# Patient Record
Sex: Male | Born: 1960 | Race: Black or African American | Hispanic: No | Marital: Married | Smoking: Never smoker
Health system: Southern US, Community
[De-identification: ages and names within clinical notes are randomized; demographics above are authoritative.]

## PROBLEM LIST (undated history)

## (undated) DIAGNOSIS — A770 Spotted fever due to Rickettsia rickettsii: Secondary | ICD-10-CM

## (undated) DIAGNOSIS — T7840XA Allergy, unspecified, initial encounter: Secondary | ICD-10-CM

## (undated) DIAGNOSIS — K589 Irritable bowel syndrome without diarrhea: Secondary | ICD-10-CM

## (undated) DIAGNOSIS — Z973 Presence of spectacles and contact lenses: Secondary | ICD-10-CM

## (undated) DIAGNOSIS — I10 Essential (primary) hypertension: Secondary | ICD-10-CM

## (undated) DIAGNOSIS — J45909 Unspecified asthma, uncomplicated: Secondary | ICD-10-CM

## (undated) DIAGNOSIS — K219 Gastro-esophageal reflux disease without esophagitis: Secondary | ICD-10-CM

## (undated) HISTORY — DX: Gastro-esophageal reflux disease without esophagitis: K21.9

## (undated) HISTORY — DX: Irritable bowel syndrome, unspecified: K58.9

## (undated) HISTORY — DX: Unspecified asthma, uncomplicated: J45.909

## (undated) HISTORY — DX: Essential (primary) hypertension: I10

## (undated) HISTORY — DX: Allergy, unspecified, initial encounter: T78.40XA

---

## 2005-09-19 HISTORY — PX: SHOULDER SURGERY: SHX246

## 2017-09-19 HISTORY — PX: OTHER SURGICAL HISTORY: SHX169

## 2018-09-05 ENCOUNTER — Ambulatory Visit: Payer: Federal, State, Local not specified - PPO | Admitting: Family Medicine

## 2018-09-05 ENCOUNTER — Encounter: Payer: Self-pay | Admitting: Family Medicine

## 2018-09-05 DIAGNOSIS — K582 Mixed irritable bowel syndrome: Secondary | ICD-10-CM | POA: Diagnosis not present

## 2018-09-05 DIAGNOSIS — M7542 Impingement syndrome of left shoulder: Secondary | ICD-10-CM | POA: Diagnosis not present

## 2018-09-05 DIAGNOSIS — J321 Chronic frontal sinusitis: Secondary | ICD-10-CM

## 2018-09-05 DIAGNOSIS — J329 Chronic sinusitis, unspecified: Secondary | ICD-10-CM | POA: Insufficient documentation

## 2018-09-05 DIAGNOSIS — K589 Irritable bowel syndrome without diarrhea: Secondary | ICD-10-CM | POA: Insufficient documentation

## 2018-09-05 MED ORDER — DICYCLOMINE HCL 20 MG PO TABS: 20.0000 mg | ORAL_TABLET | Freq: Four times a day (QID) | ORAL | 3 refills | Status: DC

## 2018-09-05 MED ORDER — IBUPROFEN 800 MG PO TABS: 800.0000 mg | ORAL_TABLET | Freq: Three times a day (TID) | ORAL | 5 refills | Status: DC | PRN

## 2018-09-05 MED ORDER — MONTELUKAST SODIUM 10 MG PO TABS: 10.0000 mg | ORAL_TABLET | Freq: Every day | ORAL | 3 refills | Status: DC

## 2018-09-05 MED ORDER — RANITIDINE HCL 150 MG PO CAPS: 150.0000 mg | ORAL_CAPSULE | Freq: Two times a day (BID) | ORAL | 3 refills | Status: DC

## 2018-09-05 MED ORDER — LACTULOSE 10 GM/15ML PO SOLN: 10.0000 g | Freq: Every day | ORAL | 3 refills | Status: DC | PRN

## 2018-09-05 NOTE — Progress Notes (Signed)
BP 132/81   Pulse 61   Temp (!) 97.5 F (36.4 C) (Oral)   Ht 5\' 10"  (1.778 m)   Wt 191 lb 9.6 oz (86.9 kg)   BMI 27.49 kg/m    Subjective:    Patient ID: George Garcia, male    DOB: 1961-07-13, 57 y.o.   MRN: [id number]  HPI: George Garcia is a 57 y.o. male presenting on 09/05/2018 for New Patient (Initial Visit) ([place] ) and Establish Care   HPI Chronic sinus Patient is coming in his new patient establish care with our office.  He has chronic sinus congestion and takes Singulair for this and it has been working well, he also uses Flonase for this to help out which has also been helping.  He denies any shortness of breath or wheezing or asthma.  Irritable bowel syndrome with intermittent constipation and diarrhea Patient has known irritable bowel syndrome with both intermittent constipation and diarrhea and he takes dicyclomine and lactulose for this which have been helping him for quite some time.  He believes he has had it for about 3 years.  He denies any abdominal pain but just gets intermittent bloating and gassiness.  He has had colonoscopy within the past few years which was essentially normal.  Patient has left shoulder pain that is been bothering him more recently.  He says the pain hurts more when he goes over his head or goes across his body but sometimes hurts with all range of motion.  He denies any weakness or numbness in that arm but just says that he has the pain with certain ranges of motion.  He thinks he was diagnosed once in the past with impingement syndrome of the shoulder.  He cannot recall if it was this older or the other one that he was having issues with previously.  Patient would like referral to orthopedic for this  Relevant past medical, surgical, family and social history reviewed and updated as indicated. Interim medical history since our last visit reviewed. Allergies and medications reviewed and updated.  Review of Systems  Constitutional: Negative for  chills and fever.  HENT: Positive for congestion, rhinorrhea, sinus pressure and sneezing. Negative for sinus pain and sore throat.   Eyes: Negative for visual disturbance.  Respiratory: Negative for cough, shortness of breath and wheezing.   Cardiovascular: Negative for chest pain and leg swelling.  Gastrointestinal: Positive for constipation and diarrhea. Negative for abdominal distention and abdominal pain.  Musculoskeletal: Positive for arthralgias. Negative for back pain, gait problem and joint swelling.  Skin: Negative for rash.  Neurological: Negative for dizziness, weakness, light-headedness and numbness.  All other systems reviewed and are negative.   Per HPI unless specifically indicated above  Social History   Socioeconomic History  . Marital status: Married    Spouse name: Not on file  . Number of children: Not on file  . Years of education: Not on file  . Highest education level: Not on file  Occupational History  . Not on file  Social Needs  . Financial resource strain: Not on file  . Food insecurity:    Worry: Not on file    Inability: Not on file  . Transportation needs:    Medical: Not on file    Non-medical: Not on file  Tobacco Use  . Smoking status: Never Smoker  . Smokeless tobacco: Never Used  Substance and Sexual Activity  . Alcohol use: Never    Frequency: Never  . Drug use: Never  .  Sexual activity: Yes    Comment: married since 1999, 3 child 73, 85, 96  Lifestyle  . Physical activity:    Days per week: Not on file    Minutes per session: Not on file  . Stress: Not on file  Relationships  . Social connections:    Talks on phone: Not on file    Gets together: Not on file    Attends religious service: Not on file    Active member of club or organization: Not on file    Attends meetings of clubs or organizations: Not on file    Relationship status: Not on file  . Intimate partner violence:    Fear of current or ex partner: Not on file     Emotionally abused: Not on file    Physically abused: Not on file    Forced sexual activity: Not on file  Other Topics Concern  . Not on file  Social History Narrative  . Not on file    Past Surgical History:  Procedure Laterality Date  . SHOULDER SURGERY Right 2007    Family History  Problem Relation Age of Onset  . Diabetes Mother   . Stroke Father     Allergies as of 09/05/2018   No Known Allergies     Medication List       Accurate as of September 05, 2018 10:23 AM. Always use your most recent med list.        dicyclomine 20 MG tablet Commonly known as:  BENTYL Take 1 tablet (20 mg total) by mouth every 6 (six) hours.   fluticasone 50 MCG/ACT nasal spray Commonly known as:  FLONASE Place into both nostrils daily.   ibuprofen 800 MG tablet Commonly known as:  ADVIL,MOTRIN Take 1 tablet (800 mg total) by mouth every 8 (eight) hours as needed.   lactulose 10 GM/15ML solution Commonly known as:  CHRONULAC Take 15 mLs (10 g total) by mouth daily as needed for mild constipation.   montelukast 10 MG tablet Commonly known as:  SINGULAIR Take 1 tablet (10 mg total) by mouth at bedtime.   ranitidine 150 MG capsule Commonly known as:  ZANTAC Take 1 capsule (150 mg total) by mouth 2 (two) times daily.          Objective:    BP 132/81   Pulse 61   Temp (!) 97.5 F (36.4 C) (Oral)   Ht 5\' 10"  (1.778 m)   Wt 191 lb 9.6 oz (86.9 kg)   BMI 27.49 kg/m   Wt Readings from Last 3 Encounters:  09/05/18 191 lb 9.6 oz (86.9 kg)    Physical Exam Vitals signs and nursing note reviewed.  Constitutional:      General: He is not in acute distress.    Appearance: He is well-developed. He is not diaphoretic.  HENT:     Nose: No congestion or rhinorrhea.     Mouth/Throat:     Mouth: Mucous membranes are moist.     Pharynx: Oropharynx is clear. No posterior oropharyngeal erythema.  Eyes:     General: No scleral icterus.    Conjunctiva/sclera: Conjunctivae normal.   Neck:     Musculoskeletal: Neck supple.     Thyroid: No thyromegaly.  Cardiovascular:     Rate and Rhythm: Normal rate and regular rhythm.     Heart sounds: Normal heart sounds. No murmur.  Pulmonary:     Effort: Pulmonary effort is normal. No respiratory distress.     Breath  sounds: Normal breath sounds. No wheezing.  Abdominal:     General: Abdomen is flat. Bowel sounds are normal. There is no distension.     Palpations: Abdomen is soft.     Tenderness: There is no abdominal tenderness. There is no guarding.  Musculoskeletal: Normal range of motion.     Left shoulder: He exhibits tenderness (Left lateral shoulder tenderness, also tender and painful with cross body and overhead range of motion). He exhibits normal range of motion, no swelling, no deformity and normal strength.  Lymphadenopathy:     Cervical: No cervical adenopathy.  Skin:    General: Skin is warm and dry.     Findings: No rash.  Neurological:     Mental Status: He is alert and oriented to person, place, and time.     Coordination: Coordination normal.  Psychiatric:        Behavior: Behavior normal.     No results found for this or any previous visit.    Assessment & Plan:   Problem List Items Addressed This Visit      Respiratory   Chronic sinusitis   Relevant Medications   fluticasone (FLONASE) 50 MCG/ACT nasal spray     Digestive   IBS (irritable bowel syndrome)   Relevant Medications   dicyclomine (BENTYL) 20 MG tablet   ranitidine (ZANTAC) 150 MG capsule   lactulose (CHRONULAC) 10 GM/15ML solution     Other   Impingement syndrome of left shoulder   Relevant Medications   ibuprofen (ADVIL,MOTRIN) 800 MG tablet   Other Relevant Orders   Ambulatory referral to Orthopedic Surgery       Follow up plan: Return in about 1 year (around 09/06/2019), or if symptoms worsen or fail to improve.  Arville CareJoshua Maurie Olesen, MD [facility] 09/05/2018, 10:23 AM

## 2018-09-24 ENCOUNTER — Telehealth: Payer: Self-pay | Admitting: Family Medicine

## 2018-09-24 MED ORDER — OMEPRAZOLE 20 MG PO CPDR: 20.0000 mg | DELAYED_RELEASE_CAPSULE | Freq: Every day | ORAL | 3 refills | Status: DC

## 2018-09-24 NOTE — Telephone Encounter (Signed)
Have the patient switch to Pepcid 20 mg twice daily which she can also get over-the-counter as well.  Or we can send a prescription, if he wants a prescription go ahead and send him a years worth of it.

## 2018-09-24 NOTE — Telephone Encounter (Signed)
Switch to what?

## 2018-09-24 NOTE — Telephone Encounter (Signed)
Insurance will not cover Pepcid, could you send in omeprazole

## 2018-09-24 NOTE — Telephone Encounter (Signed)
Yes sent omeprazole for him

## 2018-09-24 NOTE — Telephone Encounter (Signed)
Pt aware.

## 2018-10-02 ENCOUNTER — Telehealth: Payer: Self-pay | Admitting: Family Medicine

## 2018-10-02 NOTE — Telephone Encounter (Signed)
PT is states that the  omeprazole (PRILOSEC) 20 MG capsule is not strong up, states that he is wanting [place] to up the 60 MG he has took that before, pt does take this medication twice a day   Pharmacy: [facility]

## 2018-10-02 NOTE — Telephone Encounter (Signed)
Please review and advise.

## 2018-10-03 MED ORDER — OMEPRAZOLE 40 MG PO CPDR: 40.0000 mg | DELAYED_RELEASE_CAPSULE | Freq: Two times a day (BID) | ORAL | 3 refills | Status: DC

## 2018-10-03 NOTE — Telephone Encounter (Signed)
Patient aware and verbalizes understanding. 

## 2018-10-03 NOTE — Telephone Encounter (Signed)
I sent it for 40mg , I have never seen a 60 mg for this medication, 40 mg is usually the top dose, he can take 40 mg twice daily and I sent a new prescription for that.

## 2018-10-10 ENCOUNTER — Ambulatory Visit (INDEPENDENT_AMBULATORY_CARE_PROVIDER_SITE_OTHER): Payer: Federal, State, Local not specified - PPO | Admitting: Orthopaedic Surgery

## 2018-10-10 ENCOUNTER — Ambulatory Visit (INDEPENDENT_AMBULATORY_CARE_PROVIDER_SITE_OTHER): Payer: Federal, State, Local not specified - PPO

## 2018-10-10 ENCOUNTER — Encounter (INDEPENDENT_AMBULATORY_CARE_PROVIDER_SITE_OTHER): Payer: Self-pay | Admitting: Orthopaedic Surgery

## 2018-10-10 VITALS — BP 134/83 | HR 54 | Ht 70.0 in | Wt 185.0 lb

## 2018-10-10 DIAGNOSIS — M25512 Pain in left shoulder: Secondary | ICD-10-CM | POA: Diagnosis not present

## 2018-10-10 DIAGNOSIS — G8929 Other chronic pain: Secondary | ICD-10-CM | POA: Insufficient documentation

## 2018-10-10 NOTE — Progress Notes (Signed)
Office Visit Note   Patient: George Garcia           Date of Birth: 05/06/1961           MRN: [id number] Visit Date: 10/10/2018              Requested by: Dettinger, Elige Radon, MD [address] [place], [place] [address] PCP: Dettinger, Elige Radon, MD   Assessment & Plan: Visit Diagnoses:  1. Chronic left shoulder pain     Plan: MRI arthrogram.  Long discussion regarding diagnostic possibilities.  Doubt rotator cuff tear but certainly could have a labral tear.  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  Return after the MRI arthrogram. office visit over 40 minutes 50% of the time in counseling  Follow-Up Instructions: Return after MRI arthrogram left shoulder.   Orders:  Orders Placed This Encounter  Procedures  . XR Shoulder Left  . MR Shoulder Left w/ contrast  . DL FLUORO GUIDED NEEDLE PLC ASPIRATION / INJECTTION/LOC   No orders of the defined types were placed in this encounter.     Procedures: No procedures performed   Clinical Data: No additional findings.   Subjective: Chief Complaint  Patient presents with  . Left Shoulder - Pain  Patient presents with left anterior shoulder pain x 2 years. He denies any known injury.  He describes his pain as 2/10 and states it is painful to raise his arm out in the front or out to the side.  He denies popping or catching.  He has used lidocaine ointment, ibuprofen 800mg , diclofenac gel, prednisone, and has had injections, with the last one being 6 months ago.  He states that these do not help anymore.  Mr. Oetken recently moved to the area about 3 months ago.  Prior to that he had been treated for chronic left shoulder pain in [place].  I do have notes from his orthopedist noting that he had some arthritic changes at the [facility] joint.  He had recommended an MRI scan.  He has been followed over a  period of several years with a combination of cortisone injections in the subacromial region, the acromioclavicular joint and  anti-inflammatory medicines.  He did have relief of his pain on each occasion only to have it recur.  He is having difficulty with overhead activity.  Not having any neck pain or numbness or tingling referred to other upper extremity.  Pain is localized in the area of the anterior shoulder particularly with motion.  He does have difficulty placing his arm over his head.  Has tried lidocaine ointment and diclofenac cream in addition.  Not experiencing any popping or catching HPI  Review of Systems   Objective: Vital Signs: BP 134/83   Pulse (!) 54   Ht 5\' 10"  (1.778 m)   Wt 185 lb (83.9 kg)   BMI 26.54 kg/m   Physical Exam Constitutional:      Appearance: He is well-developed.  Eyes:     Pupils: Pupils are equal, round, and reactive to light.  Pulmonary:     Effort: Pulmonary effort is normal.  Skin:    General: Skin is warm and dry.  Neurological:     Mental Status: He is alert and oriented to person, place, and time.  Psychiatric:        Behavior: Behavior normal.     Ortho Exam awake alert and oriented x3.  Comfortable sitting.  Able to place his left arm over his head  with somewhat of a circuitous motion.  No loss of motion or evidence of instability.  Pain seems to be localized along the anterior glenohumeral joint.  No masses.  Some pain at the acromioclavicular joint and the anterior subacromial region.  Does have a positive empty can and impingement testing.  Speed sign appeared to be negative  Specialty Comments:  No specialty comments available.  Imaging: Xr Shoulder Left  Result Date: 10/10/2018 Films of the left shoulder obtained in several projections.  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  Humeral head is centered about the glenoid.  Normal space between the humeral head and the acromion.  No acute change.  No ectopic calcification.    PMFS History: Patient Active Problem List   Diagnosis Date Noted  .  Chronic left shoulder pain 10/10/2018  . Impingement syndrome of left shoulder 09/05/2018  . IBS (irritable bowel syndrome) 09/05/2018  . Chronic sinusitis 09/05/2018   Past Medical History:  Diagnosis Date  . Allergy   . Asthma   . GERD (gastroesophageal reflux disease)     Family History  Problem Relation Age of Onset  . Diabetes Mother   . Stroke Father     Past Surgical History:  Procedure Laterality Date  . SHOULDER SURGERY Right 2007   Social History   Occupational History  . Not on file  Tobacco Use  . Smoking status: Never Smoker  . Smokeless tobacco: Never Used  Substance and Sexual Activity  . Alcohol use: Never    Frequency: Never  . Drug use: Never  . Sexual activity: Yes    Comment: married since 1999, 3 child 52, 16, 63

## 2018-10-24 ENCOUNTER — Ambulatory Visit (INDEPENDENT_AMBULATORY_CARE_PROVIDER_SITE_OTHER): Payer: Federal, State, Local not specified - PPO | Admitting: Orthopaedic Surgery

## 2018-10-26 ENCOUNTER — Telehealth: Payer: Self-pay | Admitting: Family Medicine

## 2018-10-26 ENCOUNTER — Other Ambulatory Visit: Payer: Self-pay | Admitting: *Deleted

## 2018-10-26 DIAGNOSIS — M7542 Impingement syndrome of left shoulder: Secondary | ICD-10-CM

## 2018-10-26 NOTE — Telephone Encounter (Signed)
We can go ahead and do the referral to Dr. Sonny Masters far for him, orthopedics but I do not know that his left shoulder pain would be related to a rheumatological condition unless he was instructed by his orthopedic to go see her.

## 2018-10-26 NOTE — Telephone Encounter (Signed)
What type of referral do you need? Orthopedic rheumatology  Have you been seen at our office for this problem? Dr. Pollyann Savoy, MD  (If no, schedule them an appointment.  They will need to be seen before a referral can be done.)  Is there a particular doctor or location that you prefer? [place]  Patient notified that referrals can take up to a week or longer to process. If they haven't heard anything within a week they should call back and speak with the referral department.   Pt has an mri apt next week he going to call to cancel because he was to see Dr Corliss Skains first.

## 2018-10-26 NOTE — Telephone Encounter (Signed)
Patient aware.

## 2018-10-31 ENCOUNTER — Ambulatory Visit (INDEPENDENT_AMBULATORY_CARE_PROVIDER_SITE_OTHER): Payer: Federal, State, Local not specified - PPO | Admitting: Orthopaedic Surgery

## 2018-11-07 DIAGNOSIS — M7542 Impingement syndrome of left shoulder: Secondary | ICD-10-CM | POA: Diagnosis not present

## 2018-11-07 DIAGNOSIS — M25512 Pain in left shoulder: Secondary | ICD-10-CM | POA: Diagnosis not present

## 2018-11-13 DIAGNOSIS — M25512 Pain in left shoulder: Secondary | ICD-10-CM | POA: Diagnosis not present

## 2018-11-20 DIAGNOSIS — M25512 Pain in left shoulder: Secondary | ICD-10-CM | POA: Diagnosis not present

## 2018-11-24 ENCOUNTER — Other Ambulatory Visit: Payer: Self-pay | Admitting: Family Medicine

## 2018-12-05 ENCOUNTER — Ambulatory Visit: Payer: Federal, State, Local not specified - PPO | Admitting: Family Medicine

## 2018-12-05 ENCOUNTER — Other Ambulatory Visit: Payer: Self-pay

## 2018-12-05 ENCOUNTER — Encounter: Payer: Self-pay | Admitting: Family Medicine

## 2018-12-05 VITALS — BP 139/92 | HR 66 | Temp 98.4°F | Ht 70.0 in | Wt 189.8 lb

## 2018-12-05 DIAGNOSIS — M898X1 Other specified disorders of bone, shoulder: Secondary | ICD-10-CM | POA: Diagnosis not present

## 2018-12-05 DIAGNOSIS — K219 Gastro-esophageal reflux disease without esophagitis: Secondary | ICD-10-CM

## 2018-12-05 MED ORDER — CIMETIDINE 200 MG PO TABS: 200.0000 mg | ORAL_TABLET | Freq: Two times a day (BID) | ORAL | 3 refills | Status: DC

## 2018-12-05 MED ORDER — MELOXICAM 15 MG PO TABS: 15.0000 mg | ORAL_TABLET | Freq: Every day | ORAL | 3 refills | Status: DC

## 2018-12-05 MED ORDER — OMEPRAZOLE 40 MG PO CPDR: 40.0000 mg | DELAYED_RELEASE_CAPSULE | Freq: Two times a day (BID) | ORAL | 3 refills | Status: DC

## 2018-12-05 NOTE — Progress Notes (Signed)
BP (!) 139/92   Pulse 66   Temp 98.4 F (36.9 C) (Oral)   Ht 5\' 10"  (1.778 m)   Wt 189 lb 12.8 oz (86.1 kg)   BMI 27.23 kg/m    Subjective:    Patient ID: George Garcia, male    DOB: 1961-01-02, 58 y.o.   MRN: [id number]  HPI: George Garcia is a 58 y.o. male presenting on 12/05/2018 for Gastroesophageal Reflux (Patient states that the omeptazole is not helping with his acid as much) and Arthritis (Left clavical )   HPI GERD Patient is currently on Meprazole but feels like it is not working as well, patient said that before ranitidine went on recall that it worked a lot better and would like to try something similar if possible..  He has been having a lot of complaints of belching and burping and acid and indigestion.  He also complains of epigastric abdominal discomfort.  Shoulder/ clavicle pain Patient comes in complaining of continued right shoulder pain and says that he was diagnosed with edema around the clavicle by the orthopedic and did not know what necessarily needed to be done for this, he has been taking Advil but nothing further at this point.  Patient denies any loss of range of motion but just has pain with motion of that shoulder.  Relevant past medical, surgical, family and social history reviewed and updated as indicated. Interim medical history since our last visit reviewed. Allergies and medications reviewed and updated.  Review of Systems  Constitutional: Negative for chills and fever.  Respiratory: Negative for shortness of breath and wheezing.   Cardiovascular: Negative for chest pain and leg swelling.  Gastrointestinal: Positive for abdominal pain. Negative for constipation, diarrhea, nausea and vomiting.  Musculoskeletal: Positive for arthralgias. Negative for back pain and gait problem.  Skin: Negative for rash.  All other systems reviewed and are negative.   Per HPI unless specifically indicated above   Allergies as of 12/05/2018   No Known Allergies      Medication List       Accurate as of December 05, 2018 11:27 AM. Always use your most recent med list.        cimetidine 200 MG tablet Commonly known as:  TAGAMET Take 1 tablet (200 mg total) by mouth 2 (two) times daily.   diclofenac sodium 1 % Gel Commonly known as:  VOLTAREN Apply 2 g topically 4 (four) times daily.   dicyclomine 20 MG tablet Commonly known as:  BENTYL TAKE 1 TABLET (20 MG TOTAL) BY MOUTH EVERY 6 (SIX) HOURS.   fluticasone 50 MCG/ACT nasal spray Commonly known as:  FLONASE Place into both nostrils daily.   ibuprofen 800 MG tablet Commonly known as:  ADVIL,MOTRIN Take 1 tablet (800 mg total) by mouth every 8 (eight) hours as needed.   lactulose 10 GM/15ML solution Commonly known as:  CHRONULAC Take 15 mLs (10 g total) by mouth daily as needed for mild constipation.   Lidocaine 0.5 % Gel Apply topically.   meloxicam 15 MG tablet Commonly known as:  MOBIC Take 1 tablet (15 mg total) by mouth daily.   montelukast 10 MG tablet Commonly known as:  SINGULAIR Take 1 tablet (10 mg total) by mouth at bedtime.   omeprazole 40 MG capsule Commonly known as:  PRILOSEC Take 1 capsule (40 mg total) by mouth 2 (two) times daily before a meal.   ranitidine 150 MG capsule Commonly known as:  ZANTAC Take 1 capsule (150 mg total)  by mouth 2 (two) times daily.          Objective:    BP (!) 139/92   Pulse 66   Temp 98.4 F (36.9 C) (Oral)   Ht 5\' 10"  (1.778 m)   Wt 189 lb 12.8 oz (86.1 kg)   BMI 27.23 kg/m   Wt Readings from Last 3 Encounters:  12/05/18 189 lb 12.8 oz (86.1 kg)  10/10/18 185 lb (83.9 kg)  09/05/18 191 lb 9.6 oz (86.9 kg)    Physical Exam Vitals signs and nursing note reviewed.  Constitutional:      General: He is not in acute distress.    Appearance: He is well-developed. He is not diaphoretic.  Eyes:     General: No scleral icterus.    Conjunctiva/sclera: Conjunctivae normal.  Neck:     Musculoskeletal: Neck supple.      Thyroid: No thyromegaly.  Cardiovascular:     Rate and Rhythm: Normal rate and regular rhythm.     Heart sounds: Normal heart sounds. No murmur.  Pulmonary:     Effort: Pulmonary effort is normal. No respiratory distress.     Breath sounds: Normal breath sounds. No wheezing.  Abdominal:     Tenderness: There is abdominal tenderness in the epigastric area. There is no right CVA tenderness, left CVA tenderness, guarding or rebound.  Musculoskeletal: Normal range of motion.     Right shoulder: He exhibits tenderness. He exhibits no swelling, no effusion, no crepitus and no deformity.  Lymphadenopathy:     Cervical: No cervical adenopathy.  Skin:    General: Skin is warm and dry.     Findings: No rash.  Neurological:     Mental Status: He is alert and oriented to person, place, and time.     Coordination: Coordination normal.  Psychiatric:        Behavior: Behavior normal.     No results found for this or any previous visit.    Assessment & Plan:   Problem List Items Addressed This Visit      Digestive   GERD (gastroesophageal reflux disease) - Primary   Relevant Medications   omeprazole (PRILOSEC) 40 MG capsule   cimetidine (TAGAMET) 200 MG tablet    Other Visit Diagnoses    Clavicle pain       Patient was told he had fluid in his clavicles and arthritis, will try meloxicam but will do more research about whether he needs to see a rheumatologist   Relevant Medications   meloxicam (MOBIC) 15 MG tablet      Will start cimetidine, recommended to continue the omeprazole for now and do meloxicam for the shoulder. Follow up plan: Return in about 6 months (around 06/07/2019), or if symptoms worsen or fail to improve, for GERD and arthritis recheck.  Counseling provided for all of the vaccine components No orders of the defined types were placed in this encounter.   Arville Care, MD [facility] 12/05/2018, 11:27 AM

## 2018-12-17 ENCOUNTER — Other Ambulatory Visit: Payer: Self-pay | Admitting: *Deleted

## 2018-12-17 MED ORDER — FLUTICASONE PROPIONATE 50 MCG/ACT NA SUSP: 1.0000 | Freq: Two times a day (BID) | NASAL | 11 refills | Status: DC | PRN

## 2019-01-29 ENCOUNTER — Telehealth: Payer: Self-pay | Admitting: Family Medicine

## 2019-03-21 ENCOUNTER — Ambulatory Visit: Payer: BLUE CROSS/BLUE SHIELD | Admitting: Family Medicine

## 2019-03-21 ENCOUNTER — Encounter: Payer: Self-pay | Admitting: Family Medicine

## 2019-03-21 ENCOUNTER — Other Ambulatory Visit: Payer: Self-pay

## 2019-03-21 VITALS — BP 119/79 | HR 61 | Temp 97.7°F | Ht 70.0 in | Wt 177.6 lb

## 2019-03-21 DIAGNOSIS — S90561A Insect bite (nonvenomous), right ankle, initial encounter: Secondary | ICD-10-CM

## 2019-03-21 DIAGNOSIS — S50362A Insect bite (nonvenomous) of left elbow, initial encounter: Secondary | ICD-10-CM | POA: Diagnosis not present

## 2019-03-21 DIAGNOSIS — S80861A Insect bite (nonvenomous), right lower leg, initial encounter: Secondary | ICD-10-CM | POA: Diagnosis not present

## 2019-03-21 DIAGNOSIS — S40862A Insect bite (nonvenomous) of left upper arm, initial encounter: Secondary | ICD-10-CM

## 2019-03-21 DIAGNOSIS — W57XXXA Bitten or stung by nonvenomous insect and other nonvenomous arthropods, initial encounter: Secondary | ICD-10-CM

## 2019-03-21 MED ORDER — TRIAMCINOLONE ACETONIDE 0.1 % EX CREA: 1.0000 "application " | TOPICAL_CREAM | Freq: Two times a day (BID) | CUTANEOUS | 0 refills | Status: DC

## 2019-03-21 NOTE — Progress Notes (Signed)
BP 119/79   Pulse 61   Temp 97.7 F (36.5 C) (Oral)   Ht 5\' 10"  (1.778 m)   Wt 177 lb 9.6 oz (80.6 kg)   BMI 25.48 kg/m    Subjective:   Patient ID: George Garcia, male    DOB: 1960/10/11, 58 y.o.   MRN: [id number]  HPI: George Garcia is a 57 y.o. male presenting on 03/21/2019 for Tick Removal (back, right ankle and left arm x 2 weeks)   HPI Tick bites Patient comes in with complaints of multiple tick bites.  He says he noticed them all about a couple weeks ago.  He has 1 on his right ankle and one in the middle of his back on the left side and one on his left arm on the front of the elbow.  He says the tics were very small and his wife did remove them and he says the spots have stayed small and a very pruritic just over the past couple days.  He is using Benadryl cream and he daily takes a Claritin.  He says the itching has been more significant over the past couple days.  He denies any fevers fatigue myalgias arthralgias or abnormal rashes.  Relevant past medical, surgical, family and social history reviewed and updated as indicated. Interim medical history since our last visit reviewed. Allergies and medications reviewed and updated.  Review of Systems  Constitutional: Negative for chills, fatigue and fever.  Respiratory: Negative for shortness of breath and wheezing.   Cardiovascular: Negative for chest pain and leg swelling.  Musculoskeletal: Negative for arthralgias, back pain and gait problem.  Skin: Positive for rash. Negative for color change.  Neurological: Negative for dizziness, weakness and numbness.  All other systems reviewed and are negative.   Per HPI unless specifically indicated above   Allergies as of 03/21/2019   No Known Allergies     Medication List       Accurate as of March 21, 2019  1:55 PM. If you have any questions, ask your nurse or doctor.        STOP taking these medications   cimetidine 200 MG tablet Commonly known as: TAGAMET Stopped by:  Worthy Rancher, MD   meloxicam 15 MG tablet Commonly known as: MOBIC Stopped by: Fransisca Kaufmann Garfield Coiner, MD   montelukast 10 MG tablet Commonly known as: SINGULAIR Stopped by: Fransisca Kaufmann Maria Coin, MD   ranitidine 150 MG capsule Commonly known as: ZANTAC Stopped by: Worthy Rancher, MD     TAKE these medications   diclofenac sodium 1 % Gel Commonly known as: VOLTAREN Apply 2 g topically 4 (four) times daily.   dicyclomine 20 MG tablet Commonly known as: BENTYL TAKE 1 TABLET (20 MG TOTAL) BY MOUTH EVERY 6 (SIX) HOURS.   fluticasone 50 MCG/ACT nasal spray Commonly known as: FLONASE Place 1 spray into both nostrils 2 (two) times daily as needed for allergies or rhinitis.   lactulose 10 GM/15ML solution Commonly known as: CHRONULAC Take 15 mLs (10 g total) by mouth daily as needed for mild constipation.   Lidocaine 0.5 % Gel Apply topically.   omeprazole 40 MG capsule Commonly known as: PRILOSEC Take 1 capsule (40 mg total) by mouth 2 (two) times daily before a meal.        Objective:   BP 119/79   Pulse 61   Temp 97.7 F (36.5 C) (Oral)   Ht 5\' 10"  (1.778 m)   Wt 177 lb 9.6 oz (80.6  kg)   BMI 25.48 kg/m   Wt Readings from Last 3 Encounters:  03/21/19 177 lb 9.6 oz (80.6 kg)  12/05/18 189 lb 12.8 oz (86.1 kg)  10/10/18 185 lb (83.9 kg)    Physical Exam Vitals signs and nursing note reviewed.  Constitutional:      General: He is not in acute distress.    Appearance: He is well-developed. He is not diaphoretic.  Eyes:     General: No scleral icterus.    Conjunctiva/sclera: Conjunctivae normal.  Neck:     Musculoskeletal: Neck supple.     Thyroid: No thyromegaly.  Cardiovascular:     Rate and Rhythm: Normal rate and regular rhythm.     Heart sounds: Normal heart sounds. No murmur.  Pulmonary:     Effort: Pulmonary effort is normal. No respiratory distress.     Breath sounds: Normal breath sounds. No wheezing.  Musculoskeletal: Normal range of  motion.  Lymphadenopathy:     Cervical: No cervical adenopathy.  Skin:    General: Skin is warm and dry.     Findings: Rash (Small papular in 3 locations 1 on right inner ankle and one on antecubital fossa of left arm and one on left middle of the back, no surrounding erythema or rash, no redness or warmth noted.  The papules are very small and are nontender.) present.  Neurological:     Mental Status: He is alert and oriented to person, place, and time.     Coordination: Coordination normal.  Psychiatric:        Behavior: Behavior normal.     No results found for this or any previous visit.  Assessment & Plan:   Problem List Items Addressed This Visit    None    Visit Diagnoses    Tick bite of multiple sites    -  Primary   Relevant Medications   triamcinolone cream (KENALOG) 0.1 %   Other Relevant Orders   Lyme Ab/Western Blot Reflex   Rock[place] spotted fvr abs pnl(IgG+IgM)   Alpha-Gal Panel      Will test for Lyme disease and [place] spotted fever since it has been at least 2 weeks. Will give triamcinolone for pruritus Follow up plan: Return if symptoms worsen or fail to improve.  Counseling provided for all of the vaccine components No orders of the defined types were placed in this encounter.   Arville CareJoshua Shiheem Corporan, MD [facility] 03/21/2019, 1:55 PM

## 2019-03-27 ENCOUNTER — Telehealth: Payer: Self-pay | Admitting: Family Medicine

## 2019-03-27 DIAGNOSIS — K582 Mixed irritable bowel syndrome: Secondary | ICD-10-CM

## 2019-03-27 LAB — ALPHA-GAL PANEL
Alpha Gal IgE*: <0.1 kU/L (ref <0.10)
Beef (Bos spp) IgE: <0.1 kU/L (ref <0.35)
Class Interpretation: 0
Class Interpretation: 0
Class Interpretation: 0
Lamb/Mutton (Ovis spp) IgE: <0.1 kU/L (ref <0.35)
Pork (Sus spp) IgE: <0.1 kU/L (ref <0.35)

## 2019-03-27 LAB — RMSF, IGG, IFA: RMSF, IGG, IFA: 1:64 {titer} — ABNORMAL HIGH

## 2019-03-27 LAB — LYME AB/WESTERN BLOT REFLEX
LYME DISEASE AB, QUANT, IGM: <0.8 index (ref 0.00–0.79)
Lyme IgG/IgM Ab: <0.91 {ISR} (ref 0.00–0.90)

## 2019-03-27 LAB — ROCKY MTN SPOTTED FVR ABS PNL(IGG+IGM)
RMSF IgG: POSITIVE — AB
RMSF IgM: 0.36 index (ref 0.00–0.89)

## 2019-03-27 NOTE — Telephone Encounter (Signed)
What is the name of the medication? lactulose ([place]) 10 GM/15ML    Have you contacted your pharmacy to request a refill? YES  Which pharmacy would you like this sent to? [facility] he was seen last week by Dr. D wants to know if he has to do televisit if it can not be refilled please call patient to let him know    Patient notified that their request is being sent to the clinical staff for review and that they should receive a call once it is complete. If they do not receive a call within 24 hours they can check with their pharmacy or our office.

## 2019-03-28 ENCOUNTER — Other Ambulatory Visit: Payer: Self-pay | Admitting: *Deleted

## 2019-03-28 MED ORDER — LACTULOSE 10 GM/15ML PO SOLN: 10.0000 g | Freq: Every day | ORAL | 3 refills | Status: DC | PRN

## 2019-03-28 MED ORDER — DOXYCYCLINE HYCLATE 100 MG PO TABS: 100.0000 mg | ORAL_TABLET | Freq: Two times a day (BID) | ORAL | 0 refills | Status: DC

## 2019-03-28 NOTE — Telephone Encounter (Signed)
Patient aware Rx has been sent to the pharmacy. ?

## 2019-03-28 NOTE — Telephone Encounter (Signed)
Sent refill for the patient 

## 2019-03-28 NOTE — Addendum Note (Signed)
Addended by: Caryl Pina on: 03/28/2019 08:21 AM   Modules accepted: Orders

## 2019-04-01 ENCOUNTER — Encounter: Payer: Self-pay | Admitting: Family Medicine

## 2019-04-01 DIAGNOSIS — K582 Mixed irritable bowel syndrome: Secondary | ICD-10-CM

## 2019-04-01 MED ORDER — LACTULOSE 10 GM/15ML PO SOLN: 20.0000 g | Freq: Two times a day (BID) | ORAL | 3 refills | Status: DC

## 2019-04-02 ENCOUNTER — Encounter: Payer: Self-pay | Admitting: Family Medicine

## 2019-04-02 ENCOUNTER — Other Ambulatory Visit: Payer: Self-pay | Admitting: Family Medicine

## 2019-04-02 DIAGNOSIS — K582 Mixed irritable bowel syndrome: Secondary | ICD-10-CM

## 2019-04-08 ENCOUNTER — Other Ambulatory Visit: Payer: Self-pay

## 2019-04-08 ENCOUNTER — Encounter: Payer: Self-pay | Admitting: Family Medicine

## 2019-04-08 ENCOUNTER — Ambulatory Visit (INDEPENDENT_AMBULATORY_CARE_PROVIDER_SITE_OTHER): Payer: Federal, State, Local not specified - PPO | Admitting: Family Medicine

## 2019-04-08 DIAGNOSIS — A770 Spotted fever due to Rickettsia rickettsii: Secondary | ICD-10-CM | POA: Insufficient documentation

## 2019-04-08 DIAGNOSIS — G6289 Other specified polyneuropathies: Secondary | ICD-10-CM | POA: Insufficient documentation

## 2019-04-08 NOTE — Patient Instructions (Signed)
[facility] [place] Spotted Fever [place] spotted fever is a bacterial infection that spreads to people through contact with certain ticks. The illness causes flu-like symptoms and a reddish-purple rash. The illness does not spread from person to person (is not contagious). When this condition is not treated right away, it can quickly become very serious, and can sometimes lead to long-term (chronic) health problems or even death. What are the causes? This condition is caused by a type of bacteria (Rickettsia rickettsii) that is carried by [place] dog ticks, brown dog ticks, and [employer] wood ticks. The infection spreads through:  A bite from an infected tick. Tick bites are usually painless, and they frequently are not noticed.  Infected tick blood, body fluids, or feces that get into the body through damaged skin, such as a small cut or sore. This could happen while removing a tick from a pet or from another person. What increases the risk? The following factors may make you more likely to develop this condition:  Spending a lot of time outdoors, especially in rural areas or areas with long grass.  Spending time outdoors during warm weather. Ticks are most active during warm weather. What are the signs or symptoms? Symptoms of this condition include:  Fever.  Muscle aches.  Headache.  Nausea.  Vomiting.  Poor appetite.  Abdominal pain.  A reddish-purple rash. ? This usually appears 2-5 days after the first symptoms begin. ? The rash often starts on the wrists, forearms, and ankles. It may then spread to the palms, the bottom of the feet, legs, and trunk. Symptoms may develop 2-14 days after a tick bite. How is this diagnosed? This condition is diagnosed based on:  Your medical history.  A physical exam.  Blood tests.  Whether you have recently been bitten by a tick or spent time in areas where: ? Ticks are common. ? [facility] spotted fever is common.  How is this treated? This condition is treated with antibiotic medicines. It is important to begin treatment right away. In some cases, your health care provider may begin treatment before the diagnosis is confirmed. If your symptoms are severe, you may need to be treated in the hospital where you can get antibiotics and be monitored during treatment. Follow these instructions at home:  Take over-the-counter and prescription medicines only as told by your health care provider.  Take your antibiotic medicine as told by your health care provider. Do not stop taking the antibiotic even if you start to feel better.  Rest as much as possible until you feel better. Return to your normal activities as told by your health care provider.  Drink enough fluid to keep your urine pale yellow.  Keep all follow-up visits as told by your health care provider. This is important. Contact a health care provider if:  You have a rash that gets increasingly red or swollen.  You have fluid draining from any areas of your rash. Get help right away if:  You develop a fever after being bitten by a tick.  You develop a rash 2-5 days after experiencing flu-like symptoms.  You have chest pain.  You have shortness of breath.  You have a severe headache.  You have jerky movements you cannot control (seizure).  You have severe abdominal pain.  You feel confused.  You bruise easily.  You have bleeding from your gums.  You have blood in your stool (feces).  You have trouble controlling when you urinate or have bowel  movements (incontinence).  You have vision problems.  You have numbness or tingling in your arms or legs. Summary  [facility] spotted fever is a bacterial infection that spreads to people through contact with certain ticks.  When this condition is not treated right away, it can quickly become very serious, and can sometimes lead to long-term (chronic) health problems or even death.   You are more likely to develop this infection if you spend time outdoors in warm weather and in areas with tall grass.  Symptoms of this condition include fever, headache, nausea, vomiting, abdominal pain, muscle aches, and a reddish-purple rash that usually appears 2-5 days after a fever.  This condition is treated with antibiotic medicines. This information is not intended to replace advice given to you by your health care provider. Make sure you discuss any questions you have with your health care provider. Document Released: 12/18/2000 Document Revised: 09/08/2017 Document Reviewed: 12/01/2016 Elsevier Patient Education  [place].

## 2019-04-08 NOTE — Progress Notes (Addendum)
Virtual Visit via telephone Note Due to COVID-19, visit is conducted virtually and was requested by patient. This visit type was conducted due to national recommendations for restrictions regarding the COVID-19 Pandemic (e.g. social distancing) in an effort to limit this patient's exposure and mitigate transmission in our community. All issues noted in this document were discussed and addressed.  A physical exam was not performed with this format.   I connected with George PinkJames Quilling on 04/08/19 at 1430 by telephone and verified that I am speaking with the correct person using two identifiers. George PinkJames Mawson is currently located at home and no one is currently with them during visit. The provider, Kari BaarsMichelle Rakes, FNP is located in their office at time of visit.  I discussed the limitations, risks, security and privacy concerns of performing an evaluation and management service by telephone and the availability of in person appointments. I also discussed with the patient that there may be a patient responsible charge related to this service. The patient expressed understanding and agreed to proceed.  Subjective:  Patient ID: George PinkJames Mangan, male    DOB: 11/12/1960, 58 y.o.   MRN: [id number]  Chief Complaint:  Numbness   HPI: George Garcia is a 58 y.o. male presenting on 04/08/2019 for Numbness   Pt was seen on 03/21/2019 for multiple tick bites and associated rash. RMSF IGG came back positive and Dr. Louanne Skyeettinger placed pt on 21 day course of doxycycline starting 03/28/2019. Pt states he has been taking the medications as prescribed. States he is now having tingling on the bottoms of both feet and in the right wrist. Pt states this started a few days ago. States it is intermittent. No fever, chills, weakness, numbness, or loss of function. States his wrist feels tight at times. No erythema, swelling, or rash. He states he also has intermittent right otalgia. States this comes and goes. No dizziness, drainage, headache,  or lymphadenopathy.     Relevant past medical, surgical, family, and social history reviewed and updated as indicated.  Allergies and medications reviewed and updated.   Past Medical History:  Diagnosis Date  . Allergy   . Asthma   . GERD (gastroesophageal reflux disease)     Past Surgical History:  Procedure Laterality Date  . SHOULDER SURGERY Right 2007    Social History   Socioeconomic History  . Marital status: Married    Spouse name: Not on file  . Number of children: Not on file  . Years of education: Not on file  . Highest education level: Not on file  Occupational History  . Not on file  Social Needs  . Financial resource strain: Not on file  . Food insecurity    Worry: Not on file    Inability: Not on file  . Transportation needs    Medical: Not on file    Non-medical: Not on file  Tobacco Use  . Smoking status: Never Smoker  . Smokeless tobacco: Never Used  Substance and Sexual Activity  . Alcohol use: Never    Frequency: Never  . Drug use: Never  . Sexual activity: Yes    Comment: married since 1999, 3 child 2234, 4128, 5324  Lifestyle  . Physical activity    Days per week: Not on file    Minutes per session: Not on file  . Stress: Not on file  Relationships  . Social Musicianconnections    Talks on phone: Not on file    Gets together: Not on file  Attends religious service: Not on file    Active member of club or organization: Not on file    Attends meetings of clubs or organizations: Not on file    Relationship status: Not on file  . Intimate partner violence    Fear of current or ex partner: Not on file    Emotionally abused: Not on file    Physically abused: Not on file    Forced sexual activity: Not on file  Other Topics Concern  . Not on file  Social History Narrative  . Not on file    Outpatient Encounter Medications as of 04/08/2019  Medication Sig  . CONSTULOSE 10 GM/15ML solution Please specify directions, refills and quantity  .  diclofenac sodium (VOLTAREN) 1 % GEL Apply 2 g topically 4 (four) times daily.  Marland Kitchen. dicyclomine (BENTYL) 20 MG tablet TAKE 1 TABLET (20 MG TOTAL) BY MOUTH EVERY 6 (SIX) HOURS.  Marland Kitchen. doxycycline (VIBRA-TABS) 100 MG tablet Take 1 tablet (100 mg total) by mouth 2 (two) times daily. 1 po bid  . fluticasone (FLONASE) 50 MCG/ACT nasal spray Place 1 spray into both nostrils 2 (two) times daily as needed for allergies or rhinitis.  . Lidocaine 0.5 % GEL Apply topically.  Marland Kitchen. omeprazole (PRILOSEC) 40 MG capsule Take 1 capsule (40 mg total) by mouth 2 (two) times daily before a meal.  . triamcinolone cream (KENALOG) 0.1 % Apply 1 application topically 2 (two) times daily.   No facility-administered encounter medications on file as of 04/08/2019.     No Known Allergies  Review of Systems  Constitutional: Negative for activity change, appetite change, chills, diaphoresis, fatigue, fever and unexpected weight change.  HENT: Positive for ear pain. Negative for congestion, ear discharge, facial swelling, hearing loss, postnasal drip, rhinorrhea, sinus pressure, sinus pain, sore throat, tinnitus, trouble swallowing and voice change.   Eyes: Negative for photophobia and visual disturbance.  Respiratory: Negative for cough, chest tightness and shortness of breath.   Cardiovascular: Negative for chest pain, palpitations and leg swelling.  Gastrointestinal: Negative for abdominal pain, nausea and vomiting.  Genitourinary: Negative for decreased urine volume and difficulty urinating.  Musculoskeletal: Positive for arthralgias and myalgias. Negative for back pain, gait problem, joint swelling, neck pain and neck stiffness.  Skin: Negative for color change and rash.  Neurological: Positive for numbness (tingling to bilateral soles of feet, intermittent ). Negative for dizziness, tremors, seizures, syncope, facial asymmetry, speech difficulty, weakness, light-headedness and headaches.  Psychiatric/Behavioral: Negative for  confusion.  All other systems reviewed and are negative.        Observations/Objective: No vital signs or physical exam, this was a telephone or virtual health encounter.  Pt alert and oriented, answers all questions appropriately, and able to speak in full sentences.    Assessment and Plan: Fayrene FearingJames was seen today for numbness.  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 Pt experiencing intermittent peripheral neuropathy and arthralgias. This is likely a result of RMSF. RMSF discussed in detail with pt. Pt aware to continue the antibiotics as prescribed and to report any new or worsening symptoms. Pt to follow up if symptoms persist or worsen. Pt aware of symptoms that require emergent evaluation and treatment.     Follow Up Instructions: Return in about 2 weeks (around 04/22/2019), or if symptoms worsen or fail to improve, for RMSF.    I discussed the assessment and treatment plan  with the patient. The patient was provided an opportunity to ask questions and all were answered. The patient agreed with the plan and demonstrated an understanding of the instructions.   The patient was advised to call back or seek an in-person evaluation if the symptoms worsen or if the condition fails to improve as anticipated.  The above assessment and management plan was discussed with the patient. The patient verbalized understanding of and has agreed to the management plan. Patient is aware to call the clinic if symptoms persist or worsen. Patient is aware when to return to the clinic for a follow-up visit. Patient educated on when it is appropriate to go to the emergency department.    I provided 15 minutes of non-face-to-face time during this encounter. The call started at 1430. The call ended at 1445. The other time was used for coordination of care.    Monia Pouch, FNP-C  [facility] [address] [place], [place] [address] [phone]

## 2019-04-09 ENCOUNTER — Telehealth: Payer: Self-pay | Admitting: Family Medicine

## 2019-04-09 MED ORDER — LACTULOSE 10 GM/15ML PO SOLN: 20.0000 g | Freq: Two times a day (BID) | ORAL | 5 refills | Status: DC

## 2019-04-09 NOTE — Telephone Encounter (Signed)
I believe I fixed at this time, I saw his my chart message.

## 2019-04-09 NOTE — Telephone Encounter (Signed)
Pt aware.

## 2019-04-30 ENCOUNTER — Other Ambulatory Visit: Payer: Self-pay | Admitting: Family Medicine

## 2019-05-02 ENCOUNTER — Telehealth: Payer: Self-pay | Admitting: Family Medicine

## 2019-05-02 MED ORDER — DOXYCYCLINE HYCLATE 100 MG PO TABS: 100.0000 mg | ORAL_TABLET | Freq: Two times a day (BID) | ORAL | 0 refills | Status: DC

## 2019-05-02 NOTE — Telephone Encounter (Signed)
What is the name of the medication?  George Garcia (VIBRA-TABS) 100 MG tablet Have you contacted your pharmacy to request a refill? Yes  Which pharmacy would you like this sent to? [facility], pt is still having symptoms from [place] spotted fever   Patient notified that their request is being sent to the clinical staff for review and that they should receive a call once it is complete. If they do not receive a call within 24 hours they can check with their pharmacy or our office.

## 2019-05-02 NOTE — Telephone Encounter (Signed)
Please let the patient know that I sent in a refill of doxycycline form, if he is still having issues and after this then he would likely need to be seen again.

## 2019-05-02 NOTE — Telephone Encounter (Signed)
lmtcb

## 2019-05-02 NOTE — Telephone Encounter (Signed)
Patient was seen 7/20 with Rakes. Please advise

## 2019-05-02 NOTE — Telephone Encounter (Signed)
Patient aware and verbalized understanding. °

## 2019-05-16 ENCOUNTER — Other Ambulatory Visit: Payer: Self-pay | Admitting: Family Medicine

## 2019-07-04 ENCOUNTER — Encounter: Payer: Self-pay | Admitting: Family Medicine

## 2019-07-04 ENCOUNTER — Ambulatory Visit: Payer: Federal, State, Local not specified - PPO | Admitting: Family Medicine

## 2019-07-04 ENCOUNTER — Other Ambulatory Visit: Payer: Self-pay

## 2019-07-04 VITALS — BP 119/78 | HR 60 | Temp 99.0°F | Resp 20 | Ht 70.0 in | Wt 180.0 lb

## 2019-07-04 DIAGNOSIS — M62830 Muscle spasm of back: Secondary | ICD-10-CM | POA: Diagnosis not present

## 2019-07-04 DIAGNOSIS — M51369 Other intervertebral disc degeneration, lumbar region without mention of lumbar back pain or lower extremity pain: Secondary | ICD-10-CM | POA: Insufficient documentation

## 2019-07-04 DIAGNOSIS — G8929 Other chronic pain: Secondary | ICD-10-CM

## 2019-07-04 DIAGNOSIS — M5441 Lumbago with sciatica, right side: Secondary | ICD-10-CM | POA: Diagnosis not present

## 2019-07-04 DIAGNOSIS — M5136 Other intervertebral disc degeneration, lumbar region: Secondary | ICD-10-CM | POA: Diagnosis not present

## 2019-07-04 DIAGNOSIS — A770 Spotted fever due to Rickettsia rickettsii: Secondary | ICD-10-CM | POA: Diagnosis not present

## 2019-07-04 MED ORDER — METHYLPREDNISOLONE ACETATE 80 MG/ML IJ SUSP
80.0000 mg | Freq: Once | INTRAMUSCULAR | Status: AC
  Administered 2019-07-04: 15:00:00 80 mg via INTRAMUSCULAR

## 2019-07-04 MED ORDER — CYCLOBENZAPRINE HCL 10 MG PO TABS: 10.0000 mg | ORAL_TABLET | Freq: Three times a day (TID) | ORAL | 1 refills | Status: DC | PRN

## 2019-07-04 MED ORDER — LIDOCAINE 4 % EX PTCH: 1.0000 | MEDICATED_PATCH | Freq: Two times a day (BID) | CUTANEOUS | 3 refills | Status: DC | PRN

## 2019-07-04 MED ORDER — PREDNISONE 20 MG PO TABS: ORAL_TABLET | ORAL | 0 refills | Status: DC

## 2019-07-04 NOTE — Patient Instructions (Signed)

## 2019-07-04 NOTE — Progress Notes (Signed)
Subjective:    George Garcia is a 58 y.o. male who presents for evaluation of low back pain. The patient has had recurrent self limited episodes of low back pain in the past and previous osteoarthritis of lumbar spine. Symptoms have been present for 7 days and are gradually worsening.  Onset was related to / precipitated by a twisting movement. The pain is located in the across the lower back or radiating to right leg(s) and radiates to the buttock, right hip, right thigh. The pain is described as burning, sharp and throbbing and occurs all day. He rates his pain as a 6 on a scale of 0-10. Symptoms are exacerbated by exercise, extension, flexion, lying down, sitting, straining and twisting. Symptoms are improved by heat, ice, muscle relaxants and NSAIDs. He has also tried PT which provided some symptom relief. He has burning pain in the right leg associated with the back pain. The patient has no "red flag" history indicative of complicated back pain.  He is also concerned about recent RMSF labs. States he has completed his course of doxycycline but would like to repeat his lab work today. States he has continue myalgias at times with joint stiffness. No fever, rash, headaches, weakness, confusion, abdominal pain, nausea, vomiting, or diarrhea.   The following portions of the patient's history were reviewed and updated as appropriate: allergies, current medications, past family history, past medical history, past social history, past surgical history and problem list.  Review of Systems Constitutional: negative for anorexia, chills, fatigue, fevers, malaise, night sweats, sweats and weight loss Eyes: negative Ears, nose, mouth, throat, and face: negative Respiratory: negative Cardiovascular: negative Gastrointestinal: negative Genitourinary:negative Integument/breast: negative Musculoskeletal:positive for arthralgias, back pain, myalgias and stiff joints Neurological: negative for coordination  problems, dizziness, gait problems, headaches, memory problems, paresthesia, seizures, speech problems, tremors, vertigo and weakness Behavioral/Psych: negative    Objective:  BP 119/78   Pulse 60   Temp 99 F (37.2 C)   Resp 20   Ht 5\' 10"  (1.778 m)   Wt 180 lb (81.6 kg)   SpO2 99%   BMI 25.83 kg/m   Physical Exam  Constitutional: He is oriented to person, place, and time. Vital signs are normal. He appears not jaundiced. He appears healthy.  Non-toxic appearance. He does not have a sickly appearance. He appears distressed (mild).  HENT:  Head: Normocephalic and atraumatic.  Eyes: Pupils are equal, round, and reactive to light. Conjunctivae and EOM are normal.  Neck: Normal range of motion.  Cardiovascular: Normal rate, regular rhythm, normal heart sounds, intact distal pulses and normal pulses. Exam reveals no gallop and no friction rub.  No murmur heard. Pulmonary/Chest: Effort normal and breath sounds normal. No respiratory distress.  Abdominal: Soft. Bowel sounds are normal. There is no abdominal tenderness.  Musculoskeletal:     Right hip: He exhibits normal range of motion, normal strength, no tenderness, no bony tenderness, no swelling, no crepitus, no deformity and no laceration.     Right knee: Normal.     Cervical back: Normal.     Thoracic back: Normal.     Lumbar back: He exhibits decreased range of motion, tenderness, pain and spasm. He exhibits no bony tenderness, no swelling, no edema, no deformity, no laceration and normal pulse.       Back:     Right upper leg: He exhibits tenderness. He exhibits no bony tenderness, no swelling, no edema, no deformity and no laceration.     Comments: Bilateral  positive SLR test at 40 degrees. Limited lumbar spine ROM due to discomfort.  Lymphadenopathy:    He has no cervical adenopathy.  Neurological: He is alert and oriented to person, place, and time. He has normal sensation, normal strength, normal reflexes and intact cranial  nerves. He displays no weakness and normal reflexes. No cranial nerve deficit. He exhibits normal muscle tone. Gait (antalgic gait) abnormal. Coordination normal.  Skin: Skin is dry. No rash noted. He is not diaphoretic. No erythema. No pallor.  Psychiatric: Mood, memory, affect and judgment normal.     Assessment:  George Garcia was seen today for back pain.  Diagnoses and all orders for this visit:  Chronic midline low back pain with right-sided sciatica DDD (degenerative disc disease), lumbar Back muscle spasm Low back pain with right sided sciatica. No red flags concerning for Cauda Equina syndrome or malignancy. Will treat with below. Symptomatic care discussed. Offered PT, pt declined. Report any new, worsening, or persistent symptoms  -     methylPREDNISolone acetate (DEPO-MEDROL) injection 80 mg -     predniSONE (DELTASONE) 20 MG tablet; 2 po at sametime daily for 5 days- start tomorrow -     Lidocaine (HM LIDOCAINE PATCH) 4 % PTCH; Apply 1 patch topically 2 (two) times daily as needed.       -     Flexeril 10 mg PO TID PRN muscle spasm  RMSF [facility] spotted fever) Previous positive IgG. Pt aware this can remain positive after acute infection. Pt would like to repeat labs today as doxycycline therapy is complete.  -     Rocky mtn spotted fvr abs pnl(IgG+IgM)   Plan:    Natural history and expected course discussed. Questions answered. Neurosurgeon distributed. Proper lifting, bending technique discussed. Stretching exercises discussed. Regular aerobic and trunk strengthening exercises discussed. Short (2-4 day) period of relative rest recommended until acute symptoms improve. Ice to affected area as needed for local pain relief. Heat to affected area as needed for local pain relief. Muscle relaxants per medication orders. Follow-up in 6 weeks.    Return in about 6 weeks (around 08/15/2019), or if symptoms worsen or fail to improve, for back pain.   The above  assessment and management plan was discussed with the patient. The patient verbalized understanding of and has agreed to the management plan. Patient is aware to call the clinic if they develop any new symptoms or if symptoms fail to improve or worsen. Patient is aware when to return to the clinic for a follow-up visit. Patient educated on when it is appropriate to go to the emergency department.   Monia Pouch, FNP-C [facility] [address] [place], [place] [address] [phone]

## 2019-07-08 LAB — ROCKY MTN SPOTTED FVR ABS PNL(IGG+IGM)
RMSF IgG: POSITIVE — AB
RMSF IgM: 0.18 index (ref 0.00–0.89)

## 2019-07-08 LAB — RMSF, IGG, IFA: RMSF, IGG, IFA: <1:64 {titer}

## 2019-09-07 ENCOUNTER — Other Ambulatory Visit: Payer: Self-pay | Admitting: Family Medicine

## 2019-09-15 ENCOUNTER — Other Ambulatory Visit: Payer: Self-pay | Admitting: Family Medicine

## 2019-09-15 DIAGNOSIS — K582 Mixed irritable bowel syndrome: Secondary | ICD-10-CM

## 2019-09-22 ENCOUNTER — Other Ambulatory Visit: Payer: Self-pay | Admitting: Family Medicine

## 2019-09-23 ENCOUNTER — Other Ambulatory Visit: Payer: Self-pay | Admitting: Family Medicine

## 2019-09-23 DIAGNOSIS — K582 Mixed irritable bowel syndrome: Secondary | ICD-10-CM

## 2019-10-04 ENCOUNTER — Other Ambulatory Visit: Payer: Self-pay | Admitting: Family Medicine

## 2019-10-04 ENCOUNTER — Encounter: Payer: Self-pay | Admitting: Family Medicine

## 2019-10-06 MED ORDER — ALBUTEROL SULFATE HFA 108 (90 BASE) MCG/ACT IN AERS: 2.0000 | INHALATION_SPRAY | Freq: Four times a day (QID) | RESPIRATORY_TRACT | 3 refills | Status: AC | PRN

## 2019-10-10 ENCOUNTER — Other Ambulatory Visit: Payer: Self-pay | Admitting: Family Medicine

## 2019-10-10 NOTE — Telephone Encounter (Signed)
Ok to refill? It is in his med history but was d/c'd 03/22/19 during OV.

## 2019-10-23 ENCOUNTER — Telehealth: Payer: Self-pay | Admitting: Family Medicine

## 2019-10-31 ENCOUNTER — Other Ambulatory Visit: Payer: Self-pay | Admitting: Family Medicine

## 2019-10-31 DIAGNOSIS — G8929 Other chronic pain: Secondary | ICD-10-CM

## 2019-10-31 DIAGNOSIS — M5136 Other intervertebral disc degeneration, lumbar region: Secondary | ICD-10-CM

## 2019-10-31 DIAGNOSIS — M62830 Muscle spasm of back: Secondary | ICD-10-CM

## 2019-11-14 ENCOUNTER — Other Ambulatory Visit: Payer: Self-pay | Admitting: Family Medicine

## 2019-11-24 ENCOUNTER — Other Ambulatory Visit: Payer: Self-pay | Admitting: Family Medicine

## 2019-12-30 ENCOUNTER — Telehealth: Payer: Self-pay | Admitting: Family Medicine

## 2019-12-30 NOTE — Telephone Encounter (Signed)
Advised patient to go to urgent care but he wants to be seen here.  Scheduled in first available appointment on 01/02/2020.

## 2020-01-02 ENCOUNTER — Other Ambulatory Visit: Payer: Self-pay

## 2020-01-02 ENCOUNTER — Encounter: Payer: Self-pay | Admitting: Family Medicine

## 2020-01-02 ENCOUNTER — Ambulatory Visit (INDEPENDENT_AMBULATORY_CARE_PROVIDER_SITE_OTHER): Payer: Federal, State, Local not specified - PPO | Admitting: Family Medicine

## 2020-01-02 VITALS — BP 127/82 | HR 69 | Temp 97.8°F | Ht 70.0 in | Wt 183.4 lb

## 2020-01-02 DIAGNOSIS — R52 Pain, unspecified: Secondary | ICD-10-CM

## 2020-01-02 DIAGNOSIS — S30861A Insect bite (nonvenomous) of abdominal wall, initial encounter: Secondary | ICD-10-CM

## 2020-01-02 DIAGNOSIS — W57XXXA Bitten or stung by nonvenomous insect and other nonvenomous arthropods, initial encounter: Secondary | ICD-10-CM

## 2020-01-02 MED ORDER — DOXYCYCLINE HYCLATE 100 MG PO TABS: 100.0000 mg | ORAL_TABLET | Freq: Two times a day (BID) | ORAL | 0 refills | Status: AC

## 2020-01-02 NOTE — Patient Instructions (Signed)

## 2020-01-02 NOTE — Progress Notes (Signed)
Assessment & Plan:  1-2. Tick bite of abdomen, initial encounter/Body aches - Education provided on tick bites. Tylenol/Ibuprofen for body aches. Patient will return for lab work when it has been at least another week to allow time for antibodies to show up.  - doxycycline (VIBRA-TABS) 100 MG tablet; Take 1 tablet (100 mg total) by mouth 2 (two) times daily for 14 days. 1 po bid  Dispense: 28 tablet; Refill: 0 - Lyme Ab/Western Blot Reflex; Future - Rocky mtn spotted fvr abs pnl(IgG+IgM); Future   Follow up plan: Return if symptoms worsen or fail to improve.  Deliah Boston, MSN, APRN, FNP-C [facility]  Subjective:   Patient ID: George Garcia, male    DOB: 1960-12-02, 59 y.o.   MRN: [id number]  HPI: George Garcia is a 59 y.o. male presenting on 01/02/2020 for Tick Removal (Patient states that he removed a tick on his left side on thur and since then he has been having body aches.  Patient has [place] spotted fever and would like to know if he can have an open rx for abx as long as he keeps coming in to get lab work?)  Patient removed a tick off his upper abdomen under his left breast a week ago. He states since then he has been having body aches all over. Denies any rash, bulls-eye ring, headache, nausea, or vomiting. He has had [place] spotted fever in the past.    ROS: Negative unless specifically indicated above in HPI.   Relevant past medical history reviewed and updated as indicated.   Allergies and medications reviewed and updated.   Current Outpatient Medications:  .  albuterol (VENTOLIN HFA) 108 (90 Base) MCG/ACT inhaler, Inhale 2 puffs into the lungs every 6 (six) hours as needed for wheezing or shortness of breath., Disp: 18 g, Rfl: 3 .  CONSTULOSE 10 GM/15ML solution, TAKE 30 MLS (20 G TOTAL) BY MOUTH 2 (TWO) TIMES DAILY., Disp: 5676 mL, Rfl: 0 .  cyclobenzaprine (FLEXERIL) 10 MG tablet, Take 1 tablet (10 mg total) by mouth 3 (three) times  daily as needed for muscle spasms., Disp: 30 tablet, Rfl: 1 .  diclofenac sodium (VOLTAREN) 1 % GEL, Apply 2 g topically 4 (four) times daily., Disp: , Rfl:  .  dicyclomine (BENTYL) 20 MG tablet, TAKE 1 TABLET (20 MG TOTAL) BY MOUTH EVERY 6 (SIX) HOURS., Disp: 90 tablet, Rfl: 3 .  fluticasone (FLONASE) 50 MCG/ACT nasal spray, PLACE 1 SPRAY INTO BOTH NOSTRILS 2 (TWO) TIMES DAILY AS NEEDED FOR ALLERGIES OR RHINITIS., Disp: 48 mL, Rfl: 1 .  Lidocaine 0.5 % GEL, Apply topically., Disp: , Rfl:  .  montelukast (SINGULAIR) 10 MG tablet, TAKE 1 TABLET BY MOUTH EVERYDAY AT BEDTIME, Disp: 90 tablet, Rfl: 3 .  omeprazole (PRILOSEC) 40 MG capsule, Take 1 capsule (40 mg total) by mouth 2 (two) times daily before a meal., Disp: 180 capsule, Rfl: 3 .  SALONPAS PAIN RELIEVING 4 % PTCH, APPLY 1 PATCH TOPICALLY 2 (TWO) TIMES DAILY AS NEEDED., Disp: 18 patch, Rfl: 3 .  triamcinolone cream (KENALOG) 0.1 %, Apply 1 application topically 2 (two) times daily., Disp: 30 g, Rfl: 0 .  Camphor-Menthol-Methyl Sal (HM SALONPAS PAIN RELIEF) 1.2-5.7-6.3 % PTCH, Apply 1 patch topically daily as needed., Disp: 40 patch, Rfl: 11 .  doxycycline (VIBRA-TABS) 100 MG tablet, Take 1 tablet (100 mg total) by mouth 2 (two) times daily for 14 days. 1 po bid, Disp: 28 tablet, Rfl: 0  No Known Allergies  Objective:   BP 127/82   Pulse 69   Temp 97.8 F (36.6 C) (Temporal)   Ht 5\' 10"  (1.778 m)   Wt 183 lb 6.4 oz (83.2 kg)   SpO2 97%   BMI 26.32 kg/m    Physical Exam Vitals reviewed.  Constitutional:      General: He is not in acute distress.    Appearance: Normal appearance. He is not ill-appearing, toxic-appearing or diaphoretic.  HENT:     Head: Normocephalic and atraumatic.  Eyes:     General: No scleral icterus.       Right eye: No discharge.        Left eye: No discharge.     Conjunctiva/sclera: Conjunctivae normal.  Cardiovascular:     Rate and Rhythm: Normal rate.  Pulmonary:     Effort: Pulmonary effort is  normal. No respiratory distress.  Musculoskeletal:        General: Normal range of motion.     Cervical back: Normal range of motion.  Skin:    General: Skin is warm and dry.     Comments: Small wound under left breast where patient removed tick. No rash, erythema, drainage, or bulls-eye ring.   Neurological:     Mental Status: He is alert and oriented to person, place, and time. Mental status is at baseline.  Psychiatric:        Mood and Affect: Mood normal.        Behavior: Behavior normal.        Thought Content: Thought content normal.        Judgment: Judgment normal.

## 2020-01-03 ENCOUNTER — Telehealth: Payer: Self-pay | Admitting: Family Medicine

## 2020-01-03 NOTE — Telephone Encounter (Signed)
Pt called stating that he talked to someone at the pharmacy and was told that Dr Dettinger needs to change his Rx for Salonpas to 5% so that his insurance will pay for it.

## 2020-01-06 ENCOUNTER — Encounter: Payer: Self-pay | Admitting: Family Medicine

## 2020-01-06 MED ORDER — HM SALONPAS PAIN RELIEF 1.2-5.7-6.3 % EX PTCH: 1.0000 | MEDICATED_PATCH | Freq: Every day | CUTANEOUS | 11 refills | Status: AC | PRN

## 2020-01-06 NOTE — Telephone Encounter (Signed)
Patient aware and verbalizes understanding. 

## 2020-01-06 NOTE — Telephone Encounter (Signed)
We do not have a 5% patch in her system but I did find one that was 6.3% and tried to send that 1, we do not have a 5% in her system and is even an option.

## 2020-01-10 ENCOUNTER — Other Ambulatory Visit: Payer: Self-pay

## 2020-01-10 ENCOUNTER — Other Ambulatory Visit: Payer: Federal, State, Local not specified - PPO

## 2020-01-10 DIAGNOSIS — S30861A Insect bite (nonvenomous) of abdominal wall, initial encounter: Secondary | ICD-10-CM

## 2020-01-10 DIAGNOSIS — R52 Pain, unspecified: Secondary | ICD-10-CM | POA: Diagnosis not present

## 2020-01-10 DIAGNOSIS — W57XXXA Bitten or stung by nonvenomous insect and other nonvenomous arthropods, initial encounter: Secondary | ICD-10-CM | POA: Diagnosis not present

## 2020-01-14 LAB — ROCKY MTN SPOTTED FVR ABS PNL(IGG+IGM)
RMSF IgG: POSITIVE — AB
RMSF IgM: 0.13 index (ref 0.00–0.89)

## 2020-01-14 LAB — LYME AB/WESTERN BLOT REFLEX
LYME DISEASE AB, QUANT, IGM: <0.8 index (ref 0.00–0.79)
Lyme IgG/IgM Ab: <0.91 {ISR} (ref 0.00–0.90)

## 2020-01-14 LAB — RMSF, IGG, IFA: RMSF, IGG, IFA: <1:64 {titer}

## 2020-01-19 ENCOUNTER — Other Ambulatory Visit: Payer: Self-pay | Admitting: Family Medicine

## 2020-01-19 DIAGNOSIS — M7542 Impingement syndrome of left shoulder: Secondary | ICD-10-CM

## 2020-01-24 ENCOUNTER — Other Ambulatory Visit: Payer: Self-pay | Admitting: Family Medicine

## 2020-01-24 DIAGNOSIS — M7542 Impingement syndrome of left shoulder: Secondary | ICD-10-CM

## 2020-02-14 ENCOUNTER — Other Ambulatory Visit: Payer: Self-pay | Admitting: Family Medicine

## 2020-02-14 DIAGNOSIS — M898X1 Other specified disorders of bone, shoulder: Secondary | ICD-10-CM

## 2020-03-03 DIAGNOSIS — E663 Overweight: Secondary | ICD-10-CM | POA: Diagnosis not present

## 2020-03-03 DIAGNOSIS — Z0001 Encounter for general adult medical examination with abnormal findings: Secondary | ICD-10-CM | POA: Diagnosis not present

## 2020-03-03 DIAGNOSIS — Z0000 Encounter for general adult medical examination without abnormal findings: Secondary | ICD-10-CM | POA: Diagnosis not present

## 2020-03-03 DIAGNOSIS — Z6825 Body mass index (BMI) 25.0-25.9, adult: Secondary | ICD-10-CM | POA: Diagnosis not present

## 2020-03-03 DIAGNOSIS — K589 Irritable bowel syndrome without diarrhea: Secondary | ICD-10-CM | POA: Diagnosis not present

## 2020-03-03 DIAGNOSIS — Z1389 Encounter for screening for other disorder: Secondary | ICD-10-CM | POA: Diagnosis not present

## 2020-03-03 DIAGNOSIS — M5412 Radiculopathy, cervical region: Secondary | ICD-10-CM | POA: Diagnosis not present

## 2020-03-10 ENCOUNTER — Encounter: Payer: Self-pay | Admitting: Internal Medicine

## 2020-03-13 ENCOUNTER — Other Ambulatory Visit: Payer: Self-pay | Admitting: Family Medicine

## 2020-03-13 DIAGNOSIS — K582 Mixed irritable bowel syndrome: Secondary | ICD-10-CM

## 2020-03-16 NOTE — Telephone Encounter (Signed)
We have seen pt in the past few months but only for acute visits and there is a different PCP listed. I don't see where we have seen him for routine care or did routine labs. Ok to refill Lactulose?

## 2020-03-30 ENCOUNTER — Other Ambulatory Visit: Payer: Self-pay | Admitting: Family Medicine

## 2020-03-30 DIAGNOSIS — K219 Gastro-esophageal reflux disease without esophagitis: Secondary | ICD-10-CM

## 2020-04-14 DIAGNOSIS — M25511 Pain in right shoulder: Secondary | ICD-10-CM | POA: Diagnosis not present

## 2020-04-15 NOTE — Progress Notes (Deleted)
° °  Office Visit Note  Patient: George Garcia             Date of Birth: September 07, 1961           MRN: [id number]             PCP: Samuella Bruin Referring: Samuella Bruin Visit Date: 04/29/2020 Occupation: @GUAROCC @  Subjective:  No chief complaint on file.   History of Present Illness: Unnamed Zeien is a 59 y.o. male ***   Activities of Daily Living:  Patient reports morning stiffness for *** {minute/hour:19697}.   Patient {ACTIONS;DENIES/REPORTS:21021675::"Denies"} nocturnal pain.  Difficulty dressing/grooming: {ACTIONS;DENIES/REPORTS:21021675::"Denies"} Difficulty climbing stairs: {ACTIONS;DENIES/REPORTS:21021675::"Denies"} Difficulty getting out of chair: {ACTIONS;DENIES/REPORTS:21021675::"Denies"} Difficulty using hands for taps, buttons, cutlery, and/or writing: {ACTIONS;DENIES/REPORTS:21021675::"Denies"}  No Rheumatology ROS completed.   PMFS History:  Patient Active Problem List   Diagnosis Date Noted   DDD (degenerative disc disease), lumbar 07/04/2019   Chronic midline low back pain with right-sided sciatica 07/04/2019   RMSF [facility] spotted fever) 04/08/2019   Disease related peripheral neuropathy 04/08/2019   GERD (gastroesophageal reflux disease) 12/05/2018   Chronic left shoulder pain 10/10/2018   Impingement syndrome of left shoulder 09/05/2018   IBS (irritable bowel syndrome) 09/05/2018   Chronic sinusitis 09/05/2018    Past Medical History:  Diagnosis Date   Allergy    Asthma    GERD (gastroesophageal reflux disease)     Family History  Problem Relation Age of Onset   Diabetes Mother    Stroke Father    Past Surgical History:  Procedure Laterality Date   SHOULDER SURGERY Right 2007   Social History   Social History Narrative   Not on file    There is no immunization history on file for this patient.   Objective: Vital Signs: There were no vitals taken for this visit.   Physical Exam   Musculoskeletal  Exam: ***  CDAI Exam: CDAI Score: -- Patient Global: --; Provider Global: -- Swollen: --; Tender: -- Joint Exam 04/29/2020   No joint exam has been documented for this visit   There is currently no information documented on the homunculus. Go to the Rheumatology activity and complete the homunculus joint exam.  Investigation: No additional findings.  Imaging: No results found.  Recent Labs: No results found for: WBC, HGB, PLT, NA, K, CL, CO2, GLUCOSE, BUN, CREATININE, BILITOT, ALKPHOS, AST, ALT, PROT, ALBUMIN, CALCIUM, GFRAA, QFTBGOLD, QFTBGOLDPLUS  Speciality Comments: No specialty comments available.  Procedures:  No procedures performed Allergies: Patient has no known allergies.   Assessment / Plan:     Visit Diagnoses: Polyarthritis  Radiculopathy, cervical region  Impingement syndrome of left shoulder  RMSF [facility] spotted fever)  DDD (degenerative disc disease), lumbar  Disease related peripheral neuropathy  History of IBS  History of gastroesophageal reflux (GERD)  Orders: No orders of the defined types were placed in this encounter.  No orders of the defined types were placed in this encounter.   Face-to-face time spent with patient was *** minutes. Greater than 50% of time was spent in counseling and coordination of care.  Follow-Up Instructions: No follow-ups on file.   [facility], PA-C  Note - This record has been created using Dragon software.  Chart creation errors have been sought, but may not always  have been located. Such creation errors do not reflect on  the standard of medical care.

## 2020-04-28 ENCOUNTER — Ambulatory Visit: Payer: Self-pay

## 2020-04-28 ENCOUNTER — Encounter: Payer: Self-pay | Admitting: Rheumatology

## 2020-04-28 ENCOUNTER — Other Ambulatory Visit: Payer: Self-pay

## 2020-04-28 ENCOUNTER — Ambulatory Visit (INDEPENDENT_AMBULATORY_CARE_PROVIDER_SITE_OTHER): Payer: Federal, State, Local not specified - PPO | Admitting: Rheumatology

## 2020-04-28 VITALS — BP 127/88 | HR 57 | Resp 15 | Ht 69.0 in | Wt 183.0 lb

## 2020-04-28 DIAGNOSIS — M25512 Pain in left shoulder: Secondary | ICD-10-CM

## 2020-04-28 DIAGNOSIS — M5136 Other intervertebral disc degeneration, lumbar region: Secondary | ICD-10-CM | POA: Diagnosis not present

## 2020-04-28 DIAGNOSIS — Z8719 Personal history of other diseases of the digestive system: Secondary | ICD-10-CM

## 2020-04-28 DIAGNOSIS — M7542 Impingement syndrome of left shoulder: Secondary | ICD-10-CM

## 2020-04-28 DIAGNOSIS — M79642 Pain in left hand: Secondary | ICD-10-CM | POA: Diagnosis not present

## 2020-04-28 DIAGNOSIS — G6289 Other specified polyneuropathies: Secondary | ICD-10-CM

## 2020-04-28 DIAGNOSIS — M5030 Other cervical disc degeneration, unspecified cervical region: Secondary | ICD-10-CM

## 2020-04-28 DIAGNOSIS — M79641 Pain in right hand: Secondary | ICD-10-CM

## 2020-04-28 DIAGNOSIS — M51369 Other intervertebral disc degeneration, lumbar region without mention of lumbar back pain or lower extremity pain: Secondary | ICD-10-CM

## 2020-04-28 DIAGNOSIS — M25511 Pain in right shoulder: Secondary | ICD-10-CM | POA: Diagnosis not present

## 2020-04-28 DIAGNOSIS — Z8709 Personal history of other diseases of the respiratory system: Secondary | ICD-10-CM

## 2020-04-28 DIAGNOSIS — A770 Spotted fever due to Rickettsia rickettsii: Secondary | ICD-10-CM

## 2020-04-28 DIAGNOSIS — G8929 Other chronic pain: Secondary | ICD-10-CM

## 2020-04-28 NOTE — Progress Notes (Signed)
Office Visit Note  Patient: George Garcia             Date of Birth: 03-31-1961           MRN: [id number]             PCP: Samuella Bruin Referring: Samuella Bruin Visit Date: 04/28/2020 Occupation: @GUAROCC @  Subjective:  Pain in multiple joints   History of Present Illness: Craig Ionescu is a 59 y.o. male with history of pain in multiple joints.  According to the patient he has had lower back pain and neck pain for at least 20 years.  He has been to physical therapy and also acupuncture without much relief.  For the last 15 years he has been having discomfort in his shoulders.  He had right shoulder joint rotator cuff tear repair by Dr. 58.  His left shoulder is quite painful and he would be getting surgery for his left shoulder as well.  He states his hands are very stiff in the morning.  He has difficulty making a fist.  He has discomfort in his bilateral wrist and his bilateral hands.  He describes pain mostly over his PIP joints.  He has occasional discomfort in his knee joints when he is running.  He works out on a regular basis.  He runs about 1 mile 3 times a week.  He lifts weights as well.  Activities of Daily Living:  Patient reports morning stiffness for 24 hours.   Patient Reports nocturnal pain.  Difficulty dressing/grooming: Denies Difficulty climbing stairs: Denies Difficulty getting out of chair: Denies Difficulty using hands for taps, buttons, cutlery, and/or writing: Reports  Review of Systems  Constitutional: Negative for fatigue and night sweats.  HENT: Negative for mouth sores, mouth dryness and nose dryness.   Eyes: Negative for redness, itching and dryness.  Respiratory: Negative for shortness of breath and difficulty breathing.   Cardiovascular: Negative for chest pain, palpitations, hypertension, irregular heartbeat and swelling in legs/feet.  Gastrointestinal: Positive for diarrhea. Negative for blood in stool and constipation.       IBS    Endocrine: Negative for increased urination.  Genitourinary: Negative for difficulty urinating.  Musculoskeletal: Positive for arthralgias, joint pain, joint swelling and morning stiffness. Negative for myalgias, muscle weakness, muscle tenderness and myalgias.  Skin: Negative for color change, rash, hair loss, nodules/bumps, redness, skin tightness, ulcers and sensitivity to sunlight.  Allergic/Immunologic: Negative for susceptible to infections.  Neurological: Negative for dizziness, fainting, numbness, headaches, memory loss, night sweats and weakness.  Hematological: Negative for bruising/bleeding tendency and swollen glands.  Psychiatric/Behavioral: Negative for depressed mood, confusion and sleep disturbance. The patient is not nervous/anxious.     PMFS History:  Patient Active Problem List   Diagnosis Date Noted  . DDD (degenerative disc disease), lumbar 07/04/2019  . Chronic midline low back pain with right-sided sciatica 07/04/2019  . RMSF [facility] spotted fever) 04/08/2019  . Disease related peripheral neuropathy 04/08/2019  . GERD (gastroesophageal reflux disease) 12/05/2018  . Chronic left shoulder pain 10/10/2018  . Impingement syndrome of left shoulder 09/05/2018  . IBS (irritable bowel syndrome) 09/05/2018  . Chronic sinusitis 09/05/2018    Past Medical History:  Diagnosis Date  . Allergy   . Asthma   . GERD (gastroesophageal reflux disease)     Family History  Problem Relation Age of Onset  . Diabetes Mother   . Stroke Father   . Kidney disease Sister   . Kidney disease  Brother   . Healthy Son   . Fibroids Daughter   . Healthy Daughter   . Healthy Daughter    Past Surgical History:  Procedure Laterality Date  . SHOULDER SURGERY Right 2007   Social History   Social History Narrative  . Not on file    There is no immunization history on file for this patient.   Objective: Vital Signs: BP 127/88 (BP Location: Right Arm, Patient Position:  Sitting, Cuff Size: Normal)   Pulse (!) 57   Resp 15   Ht 5\' 9"  (1.753 m)   Wt 183 lb (83 kg)   BMI 27.02 kg/m    Physical Exam Vitals and nursing note reviewed.  Constitutional:      Appearance: He is well-developed.  HENT:     Head: Normocephalic and atraumatic.  Eyes:     Conjunctiva/sclera: Conjunctivae normal.     Pupils: Pupils are equal, round, and reactive to light.  Cardiovascular:     Rate and Rhythm: Normal rate and regular rhythm.     Heart sounds: Normal heart sounds.  Pulmonary:     Effort: Pulmonary effort is normal.     Breath sounds: Normal breath sounds.  Abdominal:     General: Bowel sounds are normal.     Palpations: Abdomen is soft.  Musculoskeletal:     Cervical back: Normal range of motion and neck supple.  Skin:    General: Skin is warm and dry.     Capillary Refill: Capillary refill takes less than 2 seconds.  Neurological:     Mental Status: He is alert and oriented to person, place, and time.  Psychiatric:        Behavior: Behavior normal.      Musculoskeletal Exam: He has limited range of motion of his cervical spine.  He has limited painful range of motion of his lumbar spine.  He has painful range of motion of bilateral shoulders.  He has contracture in his elbows.  He has limited extension of his wrist joints.  He had no synovitis on palpation of his wrist joints.  He had tenderness across PIP joints.  Hip joints and knee joints with good range of motion.  He had no tenderness across ankles.  He had no tenderness over MTPs and PIPs.  CDAI Exam: CDAI Score: -- Patient Global: --; Provider Global: -- Swollen: --; Tender: -- Joint Exam 04/28/2020   No joint exam has been documented for this visit   There is currently no information documented on the homunculus. Go to the Rheumatology activity and complete the homunculus joint exam.  Investigation: No additional findings.  Imaging: XR Hand 2 View Left  Result Date: 04/28/2020 No MCP,  intercarpal or radiocarpal joint space narrowing was noted.  No DIP narrowing was noted.  PIP narrowing was noted.  No erosive changes were noted. Impression: These findings are consistent with osteoarthritis of the hand.  XR Hand 2 View Right  Result Date: 04/28/2020 First MCP narrowing was noted.  No other MCP, intercarpal or radiocarpal joint space narrowing was noted.  PIP narrowing was noted.  No DIP narrowing was noted.  No erosive changes were noted. Impression: These findings are consistent with osteoarthritis of the hand.  XR Lumbar Spine 2-3 Views  Result Date: 04/28/2020 Mild L4-L5, L5-S1 narrowing was noted.  Facet joint arthropathy was noted.  No SI joint changes were noted. Impression: These findings are consistent spondylosis and facet joint arthropathy.   Recent Labs: No results found  for: WBC, HGB, PLT, NA, K, CL, CO2, GLUCOSE, BUN, CREATININE, BILITOT, ALKPHOS, AST, ALT, PROT, ALBUMIN, CALCIUM, GFRAA, QFTBGOLD, QFTBGOLDPLUS  Speciality Comments: No specialty comments available.  Procedures:  No procedures performed Allergies: Patient has no known allergies.   Assessment / Plan:     Visit Diagnoses: Pain in both hands -complains of pain and discomfort in his bilateral hands and wrists.  He has tenderness across his PIPs.  No synovitis was noted.  Plan: XR Hand 2 View Right, XR Hand 2 View Left.  X-rays of bilateral hands were consistent with osteoarthritis.  Handout on hand exercises was given.  A list of natural anti-inflammatories was given.  Chronic pain of both shoulders-he complains of pain and discomfort in his bilateral shoulders for many years.  He had right shoulder joint surgery by Dr. Aundria Rud.  Impingement syndrome of left shoulder-I reviewed x-ray done by Dr. Cleophas Dunker in 2020 which was unremarkable except for acromioclavicular arthritis.  DDD (degenerative disc disease), cervical -he has limited range of motion of cervical spine.  Followed by Dr. Aundria Rud at  [facility].  DDD (degenerative disc disease), lumbar -he has limited range of motion of his lumbar spine.  He has chronic lower back pain for many years.  Plan: XR Lumbar Spine 2-3 Views.  X-rays are consistent with spondylosis and facet joint arthropathy.  No SI joint changes were noted.  A handout on back exercises was given.  Disease related peripheral neuropathy-he complains of some right-sided radiculopathy at times and numbness in his right foot.  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  He was advised to get COVID-19 vaccination.  Orders: Orders Placed This Encounter  Procedures  . XR Hand 2 View Right  . XR Hand 2 View Left  . XR Lumbar Spine 2-3 Views  . Rheumatoid factor  . Cyclic citrul peptide antibody, IgG  . Uric acid  . Sedimentation rate   No orders of the defined types were placed in this encounter.     Follow-Up Instructions: Return for OA, DDD.   Pollyann Savoy, MD  Note - This record has been created using Animal nutritionist.  Chart creation errors have been sought, but may not always  have been located. Such creation errors do not reflect on  the standard of medical care.

## 2020-04-28 NOTE — Patient Instructions (Signed)
Back Exercises The following exercises strengthen the muscles that help to support the trunk and back. They also help to keep the lower back flexible. Doing these exercises can help to prevent back pain or lessen existing pain.  If you have back pain or discomfort, try doing these exercises 2-3 times each day or as told by your health care provider.  As your pain improves, do them once each day, but increase the number of times that you repeat the steps for each exercise (do more repetitions).  To prevent the recurrence of back pain, continue to do these exercises once each day or as told by your health care provider. Do exercises exactly as told by your health care provider and adjust them as directed. It is normal to feel mild stretching, pulling, tightness, or discomfort as you do these exercises, but you should stop right away if you feel sudden pain or your pain gets worse. Exercises Single knee to chest Repeat these steps 3-5 times for each leg: 1. Lie on your back on a firm bed or the floor with your legs extended. 2. Bring one knee to your chest. Your other leg should stay extended and in contact with the floor. 3. Hold your knee in place by grabbing your knee or thigh with both hands and hold. 4. Pull on your knee until you feel a gentle stretch in your lower back or buttocks. 5. Hold the stretch for 10-30 seconds. 6. Slowly release and straighten your leg. Pelvic tilt Repeat these steps 5-10 times: 1. Lie on your back on a firm bed or the floor with your legs extended. 2. Bend your knees so they are pointing toward the ceiling and your feet are flat on the floor. 3. Tighten your lower abdominal muscles to press your lower back against the floor. This motion will tilt your pelvis so your tailbone points up toward the ceiling instead of pointing to your feet or the floor. 4. With gentle tension and even breathing, hold this position for 5-10 seconds. Cat-cow Repeat these steps until  your lower back becomes more flexible: 1. Get into a hands-and-knees position on a firm surface. Keep your hands under your shoulders, and keep your knees under your hips. You may place padding under your knees for comfort. 2. Let your head hang down toward your chest. Contract your abdominal muscles and point your tailbone toward the floor so your lower back becomes rounded like the back of a cat. 3. Hold this position for 5 seconds. 4. Slowly lift your head, let your abdominal muscles relax and point your tailbone up toward the ceiling so your back forms a sagging arch like the back of a cow. 5. Hold this position for 5 seconds.  Press-ups Repeat these steps 5-10 times: 1. Lie on your abdomen (face-down) on the floor. 2. Place your palms near your head, about shoulder-width apart. 3. Keeping your back as relaxed as possible and keeping your hips on the floor, slowly straighten your arms to raise the top half of your body and lift your shoulders. Do not use your back muscles to raise your upper torso. You may adjust the placement of your hands to make yourself more comfortable. 4. Hold this position for 5 seconds while you keep your back relaxed. 5. Slowly return to lying flat on the floor.  Bridges Repeat these steps 10 times: 1. Lie on your back on a firm surface. 2. Bend your knees so they are pointing toward the ceiling and   your feet are flat on the floor. Your arms should be flat at your sides, next to your body. 3. Tighten your buttocks muscles and lift your buttocks off the floor until your waist is at almost the same height as your knees. You should feel the muscles working in your buttocks and the back of your thighs. If you do not feel these muscles, slide your feet 1-2 inches farther away from your buttocks. 4. Hold this position for 3-5 seconds. 5. Slowly lower your hips to the starting position, and allow your buttocks muscles to relax completely. If this exercise is too easy, try  doing it with your arms crossed over your chest. Abdominal crunches Repeat these steps 5-10 times: 1. Lie on your back on a firm bed or the floor with your legs extended. 2. Bend your knees so they are pointing toward the ceiling and your feet are flat on the floor. 3. Cross your arms over your chest. 4. Tip your chin slightly toward your chest without bending your neck. 5. Tighten your abdominal muscles and slowly raise your trunk (torso) high enough to lift your shoulder blades a tiny bit off the floor. Avoid raising your torso higher than that because it can put too much stress on your low back and does not help to strengthen your abdominal muscles. 6. Slowly return to your starting position. Back lifts Repeat these steps 5-10 times: 1. Lie on your abdomen (face-down) with your arms at your sides, and rest your forehead on the floor. 2. Tighten the muscles in your legs and your buttocks. 3. Slowly lift your chest off the floor while you keep your hips pressed to the floor. Keep the back of your head in line with the curve in your back. Your eyes should be looking at the floor. 4. Hold this position for 3-5 seconds. 5. Slowly return to your starting position. Contact a health care provider if:  Your back pain or discomfort gets much worse when you do an exercise.  Your worsening back pain or discomfort does not lessen within 2 hours after you exercise. If you have any of these problems, stop doing these exercises right away. Do not do them again unless your health care provider says that you can. Get help right away if:  You develop sudden, severe back pain. If this happens, stop doing the exercises right away. Do not do them again unless your health care provider says that you can. This information is not intended to replace advice given to you by your health care provider. Make sure you discuss any questions you have with your health care provider. Document Revised: 01/10/2019 Document  Reviewed: 06/07/2018 [employer] Patient Education  2020 [employer] Hand Exercises Hand exercises can be helpful for almost anyone. These exercises can strengthen the hands, improve flexibility and movement, and increase blood flow to the hands. These results can make work and daily tasks easier. Hand exercises can be especially helpful for people who have joint pain from arthritis or have nerve damage from overuse (carpal tunnel syndrome). These exercises can also help people who have injured a hand. Exercises Most of these hand exercises are gentle stretching and motion exercises. It is usually safe to do them often throughout the day. Warming up your hands before exercise may help to reduce stiffness. You can do this with gentle massage or by placing your hands in warm water for 10-15 minutes. It is normal to feel some stretching, pulling, tightness, or mild discomfort as   you begin new exercises. This will gradually improve. Stop an exercise right away if you feel sudden, severe pain or your pain gets worse. Ask your health care provider which exercises are best for you. Knuckle bend or "claw" fist 1. Stand or sit with your arm, hand, and all five fingers pointed straight up. Make sure to keep your wrist straight during the exercise. 2. Gently bend your fingers down toward your palm until the tips of your fingers are touching the top of your palm. Keep your big knuckle straight and just bend the small knuckles in your fingers. 3. Hold this position for __________ seconds. 4. Straighten (extend) your fingers back to the starting position. Repeat this exercise 5-10 times with each hand. Full finger fist 1. Stand or sit with your arm, hand, and all five fingers pointed straight up. Make sure to keep your wrist straight during the exercise. 2. Gently bend your fingers into your palm until the tips of your fingers are touching the middle of your palm. 3. Hold this position for __________  seconds. 4. Extend your fingers back to the starting position, stretching every joint fully. Repeat this exercise 5-10 times with each hand. Straight fist 1. Stand or sit with your arm, hand, and all five fingers pointed straight up. Make sure to keep your wrist straight during the exercise. 2. Gently bend your fingers at the big knuckle, where your fingers meet your hand, and the middle knuckle. Keep the knuckle at the tips of your fingers straight and try to touch the bottom of your palm. 3. Hold this position for __________ seconds. 4. Extend your fingers back to the starting position, stretching every joint fully. Repeat this exercise 5-10 times with each hand. Tabletop 1. Stand or sit with your arm, hand, and all five fingers pointed straight up. Make sure to keep your wrist straight during the exercise. 2. Gently bend your fingers at the big knuckle, where your fingers meet your hand, as far down as you can while keeping the small knuckles in your fingers straight. Think of forming a tabletop with your fingers. 3. Hold this position for __________ seconds. 4. Extend your fingers back to the starting position, stretching every joint fully. Repeat this exercise 5-10 times with each hand. Finger spread 1. Place your hand flat on a table with your palm facing down. Make sure your wrist stays straight as you do this exercise. 2. Spread your fingers and thumb apart from each other as far as you can until you feel a gentle stretch. Hold this position for __________ seconds. 3. Bring your fingers and thumb tight together again. Hold this position for __________ seconds. Repeat this exercise 5-10 times with each hand. Making circles 1. Stand or sit with your arm, hand, and all five fingers pointed straight up. Make sure to keep your wrist straight during the exercise. 2. Make a circle by touching the tip of your thumb to the tip of your index finger. 3. Hold for __________ seconds. Then open your  hand wide. 4. Repeat this motion with your thumb and each finger on your hand. Repeat this exercise 5-10 times with each hand. Thumb motion 1. Sit with your forearm resting on a table and your wrist straight. Your thumb should be facing up toward the ceiling. Keep your fingers relaxed as you move your thumb. 2. Lift your thumb up as high as you can toward the ceiling. Hold for __________ seconds. 3. Bend your thumb across your palm as   far as you can, reaching the tip of your thumb for the small finger (pinkie) side of your palm. Hold for __________ seconds. Repeat this exercise 5-10 times with each hand. Grip strengthening  1. Hold a stress ball or other soft ball in the middle of your hand. 2. Slowly increase the pressure, squeezing the ball as much as you can without causing pain. Think of bringing the tips of your fingers into the middle of your palm. All of your finger joints should bend when doing this exercise. 3. Hold your squeeze for __________ seconds, then relax. Repeat this exercise 5-10 times with each hand. Contact a health care provider if:  Your hand pain or discomfort gets much worse when you do an exercise.  Your hand pain or discomfort does not improve within 2 hours after you exercise. If you have any of these problems, stop doing these exercises right away. Do not do them again unless your health care provider says that you can. Get help right away if:  You develop sudden, severe hand pain or swelling. If this happens, stop doing these exercises right away. Do not do them again unless your health care provider says that you can. This information is not intended to replace advice given to you by your health care provider. Make sure you discuss any questions you have with your health care provider. Document Revised: 12/27/2018 Document Reviewed: 09/06/2018 Elsevier Patient Education  2020 [employer]  

## 2020-04-29 ENCOUNTER — Ambulatory Visit: Payer: Federal, State, Local not specified - PPO | Admitting: Rheumatology

## 2020-04-29 LAB — URIC ACID: Uric Acid, Serum: 4.1 mg/dL (ref 4.0–8.0)

## 2020-04-29 LAB — CYCLIC CITRUL PEPTIDE ANTIBODY, IGG: Cyclic Citrullin Peptide Ab: <16 UNITS

## 2020-04-29 LAB — SEDIMENTATION RATE: Sed Rate: 2 mm/h (ref 0–20)

## 2020-04-29 LAB — RHEUMATOID FACTOR: Rheumatoid fact SerPl-aCnc: <14 IU/mL (ref <14)

## 2020-05-02 NOTE — Progress Notes (Signed)
Referring Provider: Ginger Organ Primary Care Physician:  Ginger Organ Primary Gastroenterologist:  Dr. Abbey Chatters  Chief Complaint  Patient presents with   Irritable Bowel Syndrome    needs to establish care. doing ok    HPI:   George Garcia is a 59 y.o. male presenting today at the request of Cory Munch, PA-C for IBS.   Reviewed information provided with referral.  Office visit note dated 03/03/2020 states IBS-requesting referral to GI for management.  Treatments attempted include dicyclomine.  Patient reported previous compound.  GI that he would like prescribed.  Labs completed 03/03/2020 revealing mild elevation of AST and ALT at 43 and 53 respectively, alk phos and total bilirubin within normal limits, hemoglobin and platelets within normal limits.  Moved from [facility] about 18 months ago. History of IBS and GERD. Has had EGD ad TCS in the past without significant findings other than polyps. IBS symptoms include alternating constipation and diarrhea and intermittent abdomina cramping. Currently taking dicyclomine up to twice a day as needed for diarrhea or abdominal cramping/gas. Doesn't require medication daily. Taking lactulose 49m twice a day. 1-2 BMs daily. About 50-50 loose to formed. No blood in the stool or black stool.  No unintentional weight loss.  Feels his current regimen is working well.   MiraLAX didn't work for constipation. Caused more gas. No significant gas with lactulose.  Prefers to continue lactulose for now as he recently refilled this medication.  Taking omeprazole 40 mg twice daily for GERD. Has been on Protonix and Nexium in the past.  Doesn't remember being on prevacid or dexilant. If eating anything acidic, tomato based, soda, or peppermint he will still have some breakthrough GERD symptoms, otherwise he does very well. No dysphagia.   Prior Colonoscopy: Last colonoscopy 2 years ago. [facility]   Elevated LFTs: No history of alcohol use or drug use. No family history of liver disease. Takes multivitamin, fish oil, tumeric tea with green tea and ginger.  No regular tylenol use.  No swelling in lower extremities, abdomen, yellowing of the eyes, confusion, bruising, or bleeding.  Past Medical History:  Diagnosis Date   Allergy    Asthma    GERD (gastroesophageal reflux disease)    IBS (irritable bowel syndrome)     Past Surgical History:  Procedure Laterality Date   SHOULDER SURGERY Right 2007   rotator cuff repair    Current Outpatient Medications  Medication Sig Dispense Refill   albuterol (VENTOLIN HFA) 108 (90 Base) MCG/ACT inhaler Inhale 2 puffs into the lungs every 6 (six) hours as needed for wheezing or shortness of breath. 18 g 3   Camphor-Menthol-Methyl Sal (HM SALONPAS PAIN RELIEF) 1.2-5.7-6.3 % PTCH Apply 1 patch topically daily as needed. 40 patch 11   cyclobenzaprine (FLEXERIL) 10 MG tablet Take 1 tablet (10 mg total) by mouth 3 (three) times daily as needed for muscle spasms. 30 tablet 1   Diclofenac Sodium (PENNSAID) 2 % SOLN Apply topically daily as needed.     dicyclomine (BENTYL) 20 MG tablet TAKE 1 TABLET (20 MG TOTAL) BY MOUTH EVERY 6 (SIX) HOURS. (Patient taking differently: Take 20 mg by mouth 2 (two) times daily. ) 90 tablet 3   fluticasone (FLONASE) 50 MCG/ACT nasal spray PLACE 1 SPRAY INTO BOTH NOSTRILS 2 (TWO) TIMES DAILY AS NEEDED FOR ALLERGIES OR RHINITIS. 48 mL 1   Ibuprofen-Famotidine (DUEXIS) 800-26.6  MG TABS Take by mouth as needed.     lactulose (CHRONULAC) 10 GM/15ML solution TAKE 30 MLS (20 G TOTAL) BY MOUTH 2 (TWO) TIMES DAILY. 1892 mL 2   lidocaine (LIDODERM) 5 % 1 patch daily.     montelukast (SINGULAIR) 10 MG tablet TAKE 1 TABLET BY MOUTH EVERYDAY AT BEDTIME 90 tablet 3   omeprazole (PRILOSEC) 40 MG capsule Take 1 capsule (40 mg total) by mouth 2 (two) times daily before a meal. 180 capsule 3     No current facility-administered medications for this visit.    Allergies as of 05/04/2020   (No Known Allergies)    Family History  Problem Relation Age of Onset   Diabetes Mother    Stroke Father    Kidney disease Sister    Kidney disease Brother    Healthy Son    Fibroids Daughter    Healthy Daughter    Healthy Daughter    Colon cancer Neg Hx     Social History   Socioeconomic History   Marital status: Married    Spouse name: Not on file   Number of children: Not on file   Years of education: Not on file   Highest education level: Not on file  Occupational History   Not on file  Tobacco Use   Smoking status: Never Smoker   Smokeless tobacco: Never Used  Vaping Use   Vaping Use: Never used  Substance and Sexual Activity   Alcohol use: Never   Drug use: Never   Sexual activity: Yes    Comment: married since 1999, 3 child 17, 73, 48  Other Topics Concern   Not on file  Social History Narrative   Not on file   Social Determinants of Health   Financial Resource Strain:    Difficulty of Paying Living Expenses:   Food Insecurity:    Worried About Charity fundraiser in the Last Year:    Arboriculturist in the Last Year:   Transportation Needs:    Film/video editor (Medical):    Lack of Transportation (Non-Medical):   Physical Activity:    Days of Exercise per Week:    Minutes of Exercise per Session:   Stress:    Feeling of Stress :   Social Connections:    Frequency of Communication with Friends and Family:    Frequency of Social Gatherings with Friends and Family:    Attends Religious Services:    Active Member of Clubs or Organizations:    Attends Music therapist:    Marital Status:   Intimate Partner Violence:    Fear of Current or Ex-Partner:    Emotionally Abused:    Physically Abused:    Sexually Abused:     Review of Systems: Gen: Denies any fever, chills, cold or flulike  symptoms, lightheadedness, dizziness, presyncope, syncope. CV: Denies chest pain or heart palpitations. Resp: Denies shortness of breath or cough. GI: See HPI GU : Denies urinary burning, urinary frequency, urinary hesitancy MS: Admits to joint pain. Following with rheumatology.  Taking Duexis as needed, not daily. Derm: Denies rash Psych: Denies depression or anxiety Heme: See HPI  Physical Exam: BP 125/81    Pulse 66    Temp (!) 96.6 F (35.9 C) (Temporal)    Ht _0  (1.753 m)    Wt 176 lb 3.2 oz (79.9 kg)    BMI 26.02 kg/m  General:   Alert and oriented. Pleasant and cooperative. Well-nourished  and well-developed.  Head:  Normocephalic and atraumatic. Eyes:  Without icterus, sclera clear and conjunctiva pink.  Ears:  Normal auditory acuity. Lungs:  Clear to auscultation bilaterally. No wheezes, rales, or rhonchi. No distress.  Heart:  S1, S2 present without murmurs appreciated.  Abdomen:  +BS, soft, non-tender and non-distended. No HSM noted. No guarding or rebound. No masses appreciated.  Rectal:  Deferred  Msk:  Symmetrical without gross deformities. Normal posture. Extremities:  Without edema. Neurologic:  Alert and  oriented x4;  grossly normal neurologically. Skin:  Intact without significant lesions or rashes. Psych:  Normal mood and affect.  Labs: 03/03/2020 CBC: WBC 3.3 (L), hemoglobin 15.9, MCV 89, MCH 29.7, MCHC 33.3, platelets 192 CMP: Glucose 93, creatinine 0.91, sodium 140, potassium 4.6, chloride 101, calcium 9.8, total protein 6.5, albumin 4.6, total bilirubin 0.4, alk phos 78, AST 43 (H), ALT 53 (H). Lipid panel: Total cholesterol 233, triglycerides 303, HDL 46, LDL 133

## 2020-05-04 ENCOUNTER — Ambulatory Visit: Payer: Federal, State, Local not specified - PPO | Admitting: Gastroenterology

## 2020-05-04 ENCOUNTER — Other Ambulatory Visit: Payer: Self-pay

## 2020-05-04 ENCOUNTER — Encounter: Payer: Self-pay | Admitting: Gastroenterology

## 2020-05-04 VITALS — BP 125/81 | HR 66 | Temp 96.6°F | Ht 69.0 in | Wt 176.2 lb

## 2020-05-04 DIAGNOSIS — K582 Mixed irritable bowel syndrome: Secondary | ICD-10-CM

## 2020-05-04 DIAGNOSIS — R7989 Other specified abnormal findings of blood chemistry: Secondary | ICD-10-CM | POA: Diagnosis not present

## 2020-05-04 DIAGNOSIS — K219 Gastro-esophageal reflux disease without esophagitis: Secondary | ICD-10-CM | POA: Diagnosis not present

## 2020-05-04 NOTE — Assessment & Plan Note (Signed)
History of IBS with alternating constipation and diarrhea along with mild intermittent abdominal cramping.  Currently taking dicyclomine up to twice daily as needed and lactulose twice daily and feels his current regimen is working well.  Previously tried MiraLAX which caused increased gas.  Denies BRBPR, melena, or unintentional weight loss.  Reports prior colonoscopy and EGD in [place] without significant findings other than a couple of polyps.  Last colonoscopy about 2 years ago.  Thinks he is supposed to repeat colonoscopy in 10 years.  Plan: Continue Bentyl 20 mg as needed for diarrhea or abdominal cramping. Continue lactulose 30 mL twice daily. Discussed other options for managing constipation.  Patient recently refilled lactulose and prefers to continue with this for now.  States he will call if he desires to try different medication. Request colonoscopy records. Follow-up in 6 months.

## 2020-05-04 NOTE — Assessment & Plan Note (Addendum)
Chronic history of GERD.  Symptoms are well controlled on omeprazole 40 mg twice daily.  As long as he avoids known reflux triggers, he does very well. Has previously been on Protonix and Nexium. No alarm symptoms.  Plan: Continue omeprazole 40 mg twice daily 30 minutes before breakfast and dinner. Avoid known reflux triggers. Follow-up in 6 months.

## 2020-05-04 NOTE — Assessment & Plan Note (Signed)
59 y.o. male with mildly elevated LFTs noted in June 2021 with AST 43 and ALT 53. Alk phos, total bilirubin, and platelets within normal limits. No history of significant alcohol use. No history of drug use. No signs or symptoms of advanced liver disease. No regular tylenol use. Takes multivitamin, fish oil, tumeric/green tea with ginger. No imaging on file.   Plan:  Hep B serologies, Hep C Ab Iron panel with ferritin.  US abdomen complete Further recommendations to follow.

## 2020-05-04 NOTE — Patient Instructions (Signed)
Please have labs and ultrasound completed when you are able.  It is fine to do this after September 1st.   Continue taking omeprazole 40 mg twice daily 30 minutes before breakfast and dinner.  Continue avoiding known reflux triggers.  Continue taking dicyclomine as needed for diarrhea or abdominal cramping.  Continue taking lactulose twice daily.  Please let me know if you desire to try a prescriptive medication for constipation rather than lactulose.  We will request prior colonoscopy and upper endoscopy records.  We will plan to see back in 6 months.  Do not hesitate to call with questions or concerns prior.  Ermalinda Memos, PA-C [facility]

## 2020-05-11 DIAGNOSIS — R749 Abnormal serum enzyme level, unspecified: Secondary | ICD-10-CM | POA: Diagnosis not present

## 2020-05-11 DIAGNOSIS — Z1159 Encounter for screening for other viral diseases: Secondary | ICD-10-CM | POA: Diagnosis not present

## 2020-05-11 DIAGNOSIS — R7989 Other specified abnormal findings of blood chemistry: Secondary | ICD-10-CM | POA: Diagnosis not present

## 2020-05-12 LAB — HEPATITIS B SURFACE ANTIBODY,QUALITATIVE: Hep B S Ab: NONREACTIVE

## 2020-05-12 LAB — HEPATITIS C ANTIBODY
Hepatitis C Ab: NONREACTIVE
SIGNAL TO CUT-OFF: 0.04 (ref <1.00)

## 2020-05-12 LAB — HEPATITIS B CORE ANTIBODY, TOTAL: Hep B Core Total Ab: NONREACTIVE

## 2020-05-12 LAB — HEPATITIS B SURFACE ANTIGEN: Hepatitis B Surface Ag: NONREACTIVE

## 2020-05-12 LAB — IRON,TIBC AND FERRITIN PANEL
%SAT: 51 % (calc) — ABNORMAL HIGH (ref 20–48)
Ferritin: 42 ng/mL (ref 38–380)
Iron: 176 ug/dL (ref 50–180)
TIBC: 344 mcg/dL (calc) (ref 250–425)

## 2020-05-14 NOTE — Progress Notes (Signed)
Patient doesn't have hepatitis B or C. No immunity to Hep B. Iron panel within normal limits except for a slight elevation in percent saturation at 51%.   I recommend we go ahead and check labs for hemochromatosis DNA for completeness. Angie, please arrange.   [facility] Clinical Pool: I see the order for patients [place], but I do not see that it is actually scheduled. Can we follow-up on this?

## 2020-05-15 ENCOUNTER — Other Ambulatory Visit: Payer: Self-pay | Admitting: Gastroenterology

## 2020-05-15 ENCOUNTER — Other Ambulatory Visit: Payer: Self-pay

## 2020-05-15 DIAGNOSIS — R7989 Other specified abnormal findings of blood chemistry: Secondary | ICD-10-CM

## 2020-05-24 ENCOUNTER — Other Ambulatory Visit: Payer: Self-pay | Admitting: Family Medicine

## 2020-05-24 DIAGNOSIS — W57XXXA Bitten or stung by nonvenomous insect and other nonvenomous arthropods, initial encounter: Secondary | ICD-10-CM

## 2020-05-29 ENCOUNTER — Ambulatory Visit: Payer: Federal, State, Local not specified - PPO | Admitting: Rheumatology

## 2020-06-01 ENCOUNTER — Ambulatory Visit: Payer: Federal, State, Local not specified - PPO | Admitting: Rheumatology

## 2020-06-02 ENCOUNTER — Ambulatory Visit (HOSPITAL_COMMUNITY): Payer: Federal, State, Local not specified - PPO

## 2020-06-05 DIAGNOSIS — H04123 Dry eye syndrome of bilateral lacrimal glands: Secondary | ICD-10-CM | POA: Diagnosis not present

## 2020-06-05 DIAGNOSIS — H40033 Anatomical narrow angle, bilateral: Secondary | ICD-10-CM | POA: Diagnosis not present

## 2020-06-16 ENCOUNTER — Ambulatory Visit: Payer: Federal, State, Local not specified - PPO | Admitting: Rheumatology

## 2020-06-16 DIAGNOSIS — Z6825 Body mass index (BMI) 25.0-25.9, adult: Secondary | ICD-10-CM | POA: Diagnosis not present

## 2020-06-16 DIAGNOSIS — L600 Ingrowing nail: Secondary | ICD-10-CM | POA: Diagnosis not present

## 2020-06-16 DIAGNOSIS — E663 Overweight: Secondary | ICD-10-CM | POA: Diagnosis not present

## 2020-06-23 DIAGNOSIS — M79674 Pain in right toe(s): Secondary | ICD-10-CM | POA: Diagnosis not present

## 2020-06-23 DIAGNOSIS — L600 Ingrowing nail: Secondary | ICD-10-CM | POA: Diagnosis not present

## 2020-07-07 ENCOUNTER — Ambulatory Visit: Payer: Federal, State, Local not specified - PPO | Admitting: Rheumatology

## 2020-07-07 DIAGNOSIS — L600 Ingrowing nail: Secondary | ICD-10-CM | POA: Diagnosis not present

## 2020-07-15 ENCOUNTER — Other Ambulatory Visit: Payer: Self-pay | Admitting: Family Medicine

## 2020-07-18 ENCOUNTER — Other Ambulatory Visit: Payer: Self-pay | Admitting: Family Medicine

## 2020-07-31 ENCOUNTER — Other Ambulatory Visit: Payer: Self-pay | Admitting: Family Medicine

## 2020-08-10 ENCOUNTER — Other Ambulatory Visit: Payer: Self-pay | Admitting: Family Medicine

## 2020-08-10 DIAGNOSIS — K582 Mixed irritable bowel syndrome: Secondary | ICD-10-CM

## 2020-08-25 DIAGNOSIS — J019 Acute sinusitis, unspecified: Secondary | ICD-10-CM | POA: Diagnosis not present

## 2020-09-16 ENCOUNTER — Other Ambulatory Visit: Payer: Self-pay | Admitting: Family Medicine

## 2020-09-17 ENCOUNTER — Other Ambulatory Visit: Payer: Self-pay | Admitting: Family Medicine

## 2021-02-01 DIAGNOSIS — M9903 Segmental and somatic dysfunction of lumbar region: Secondary | ICD-10-CM | POA: Diagnosis not present

## 2021-02-01 DIAGNOSIS — M62830 Muscle spasm of back: Secondary | ICD-10-CM | POA: Diagnosis not present

## 2021-02-01 DIAGNOSIS — M9902 Segmental and somatic dysfunction of thoracic region: Secondary | ICD-10-CM | POA: Diagnosis not present

## 2021-02-01 DIAGNOSIS — M9901 Segmental and somatic dysfunction of cervical region: Secondary | ICD-10-CM | POA: Diagnosis not present

## 2021-02-03 DIAGNOSIS — M62830 Muscle spasm of back: Secondary | ICD-10-CM | POA: Diagnosis not present

## 2021-02-03 DIAGNOSIS — M9902 Segmental and somatic dysfunction of thoracic region: Secondary | ICD-10-CM | POA: Diagnosis not present

## 2021-02-03 DIAGNOSIS — M9903 Segmental and somatic dysfunction of lumbar region: Secondary | ICD-10-CM | POA: Diagnosis not present

## 2021-02-03 DIAGNOSIS — M9901 Segmental and somatic dysfunction of cervical region: Secondary | ICD-10-CM | POA: Diagnosis not present

## 2021-06-17 DIAGNOSIS — K589 Irritable bowel syndrome without diarrhea: Secondary | ICD-10-CM | POA: Diagnosis not present

## 2021-06-17 DIAGNOSIS — Z0000 Encounter for general adult medical examination without abnormal findings: Secondary | ICD-10-CM | POA: Diagnosis not present

## 2021-06-17 DIAGNOSIS — E782 Mixed hyperlipidemia: Secondary | ICD-10-CM | POA: Diagnosis not present

## 2021-06-17 DIAGNOSIS — Z13220 Encounter for screening for lipoid disorders: Secondary | ICD-10-CM | POA: Diagnosis not present

## 2021-08-10 DIAGNOSIS — E663 Overweight: Secondary | ICD-10-CM | POA: Diagnosis not present

## 2021-08-10 DIAGNOSIS — Z6826 Body mass index (BMI) 26.0-26.9, adult: Secondary | ICD-10-CM | POA: Diagnosis not present

## 2021-08-10 DIAGNOSIS — Z1331 Encounter for screening for depression: Secondary | ICD-10-CM | POA: Diagnosis not present

## 2021-08-10 DIAGNOSIS — K649 Unspecified hemorrhoids: Secondary | ICD-10-CM | POA: Diagnosis not present

## 2021-08-24 ENCOUNTER — Ambulatory Visit: Payer: Self-pay | Admitting: General Surgery

## 2021-08-24 DIAGNOSIS — K645 Perianal venous thrombosis: Secondary | ICD-10-CM | POA: Diagnosis not present

## 2021-08-24 NOTE — H&P (Signed)
°REFERRING PHYSICIAN:  Mann, Benjamin Lowell, * ° °PROVIDER:  Digna Countess CHRISTINE Jaquelinne Glendening, MD ° °MRN: [id number] °DOB: 12/13/1960 °DATE OF ENCOUNTER: 08/24/2021 ° °Subjective  ° °Chief Complaint: New Consultation (External hemorrhoids ) °  ° ° °History of Present Illness: °George Garcia is a 60 y.o. male who is seen today as an office consultation at the request of Dr. Mann for evaluation of New Consultation (External hemorrhoids ) °.  Patient reports some baseline difficulty with constipation.  He states that he takes lactulose for this.  He reports an episode of hemorrhoidal pain approximately 3 years ago.  Surgery was recommended at that time but he wanted to avoid it.  He developed similar pain several weeks ago.  He saw his primary care physician and was diagnosed with a tender painful external hemorrhoid.  He was given hydrocortisone suppositories.  He states that his pain has improved somewhat but he continues to have pain with defecation as well as pain with sitting standing and lying down.  He denies any rectal bleeding. ° ° ° °Review of Systems: °A complete review of systems was obtained from the patient.  I have reviewed this information and discussed as appropriate with the patient.  See HPI as well for other ROS. ° ° °Medical History: °Past Medical History:  °Diagnosis Date  ° Asthma, unspecified asthma severity, unspecified whether complicated, unspecified whether persistent   ° GERD (gastroesophageal reflux disease)   ° ° °Patient Active Problem List  °Diagnosis  ° Pain in joint of right shoulder  ° Chronic left shoulder pain  ° Chronic midline low back pain with right-sided sciatica  ° Chronic sinusitis  ° DDD (degenerative disc disease), lumbar  ° Disease related peripheral neuropathy  ° Elevated LFTs  ° GERD (gastroesophageal reflux disease)  ° IBS (irritable bowel syndrome)  ° Impingement syndrome of left shoulder  ° RMSF (Rocky Mountain spotted fever)  ° ° °Past Surgical History:  °Procedure Laterality  Date  ° Shoulder Surgery   2010  °  ° °No Known Allergies ° °Current Outpatient Medications on File Prior to Visit  °Medication Sig Dispense Refill  ° dicyclomine (BENTYL) 20 mg tablet dicyclomine 20 mg tablet    ° fluticasone propionate (FLONASE) 50 mcg/actuation nasal spray fluticasone propionate 50 mcg/actuation nasal spray,suspension    ° lactulose (ENULOSE) 10 gram/15 mL oral solution lactulose 10 gram/15 mL oral solution    ° lidocaine (LIDODERM) 5 % patch lidocaine 5 % topical patch ° APPLY 1 PATCH TO SKIN ONCE DAILY **MAY WEAR UP TO 12 HOURS**    ° montelukast (SINGULAIR) 10 mg tablet montelukast 10 mg tablet    ° ibuprofen-famotidine (DUEXIS) 800-26.6 mg Tab Duexis 800 mg-26.6 mg tablet ° Take 1 tablet 3 times a day by oral route.    ° omeprazole (PRILOSEC) 40 MG DR capsule omeprazole 40 mg capsule,delayed release ° TAKE 1 CAPSULE BY MOUTH TWICE A DAY BEFORE A MEAL    ° °No current facility-administered medications on file prior to visit.  ° ° °History reviewed. No pertinent family history.  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    Assessment and Plan:  Diagnoses and all orders for this visit:  Thrombosed external hemorrhoid    Patient has a chronically inflamed thrombosed hemorrhoid that has not improved with medical management.  I suggested hemorrhoidal excision with single column  hemorrhoidectomy.  We discussed the typical postoperative pain associated with this procedure.  We discussed risk such as bleeding and recurrence.  All questions were answered.

## 2021-08-24 NOTE — H&P (View-Only) (Signed)
REFERRING PHYSICIAN:  Daivd Council, *  PROVIDER:  Elenora Gamma, MD  MRN: [id number] DOB: 1961-02-28 DATE OF ENCOUNTER: 08/24/2021  Subjective   Chief Complaint: New Consultation (External hemorrhoids )     History of Present Illness: George Garcia is a 60 y.o. male who is seen today as an office consultation at the request of Dr. Loreta Ave for evaluation of New Consultation (External hemorrhoids ) .  Patient reports some baseline difficulty with constipation.  He states that he takes lactulose for this.  He reports an episode of hemorrhoidal pain approximately 3 years ago.  Surgery was recommended at that time but he wanted to avoid it.  He developed similar pain several weeks ago.  He saw his primary care physician and was diagnosed with a tender painful external hemorrhoid.  He was given hydrocortisone suppositories.  He states that his pain has improved somewhat but he continues to have pain with defecation as well as pain with sitting standing and lying down.  He denies any rectal bleeding.    Review of Systems: A complete review of systems was obtained from the patient.  I have reviewed this information and discussed as appropriate with the patient.  See HPI as well for other ROS.   Medical History: Past Medical History:  Diagnosis Date   Asthma, unspecified asthma severity, unspecified whether complicated, unspecified whether persistent    GERD (gastroesophageal reflux disease)     Patient Active Problem List  Diagnosis   Pain in joint of right shoulder   Chronic left shoulder pain   Chronic midline low back pain with right-sided sciatica   Chronic sinusitis   DDD (degenerative disc disease), lumbar   Disease related peripheral neuropathy   Elevated LFTs   GERD (gastroesophageal reflux disease)   IBS (irritable bowel syndrome)   Impingement syndrome of left shoulder   RMSF (Rocky Mountain spotted fever)    Past Surgical History:  Procedure Laterality  Date   Shoulder Surgery   2010     No Known Allergies  Current Outpatient Medications on File Prior to Visit  Medication Sig Dispense Refill   dicyclomine (BENTYL) 20 mg tablet dicyclomine 20 mg tablet     fluticasone propionate (FLONASE) 50 mcg/actuation nasal spray fluticasone propionate 50 mcg/actuation nasal spray,suspension     lactulose (ENULOSE) 10 gram/15 mL oral solution lactulose 10 gram/15 mL oral solution     lidocaine (LIDODERM) 5 % patch lidocaine 5 % topical patch  APPLY 1 PATCH TO SKIN ONCE DAILY **MAY WEAR UP TO 12 HOURS**     montelukast (SINGULAIR) 10 mg tablet montelukast 10 mg tablet     ibuprofen-famotidine (DUEXIS) 800-26.6 mg Tab Duexis 800 mg-26.6 mg tablet  Take 1 tablet 3 times a day by oral route.     omeprazole (PRILOSEC) 40 MG DR capsule omeprazole 40 mg capsule,delayed release  TAKE 1 CAPSULE BY MOUTH TWICE A DAY BEFORE A MEAL     No current facility-administered medications on file prior to visit.    History reviewed. No pertinent family history.   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    Assessment and Plan:  Diagnoses and all orders for this visit:  Thrombosed external hemorrhoid    Patient has a chronically inflamed thrombosed hemorrhoid that has not improved with medical management.  I suggested hemorrhoidal excision with single column  hemorrhoidectomy.  We discussed the typical postoperative pain associated with this procedure.  We discussed risk such as bleeding and recurrence.  All questions were answered.

## 2021-08-27 DIAGNOSIS — H40033 Anatomical narrow angle, bilateral: Secondary | ICD-10-CM | POA: Diagnosis not present

## 2021-08-27 DIAGNOSIS — H04123 Dry eye syndrome of bilateral lacrimal glands: Secondary | ICD-10-CM | POA: Diagnosis not present

## 2021-08-30 ENCOUNTER — Encounter (HOSPITAL_BASED_OUTPATIENT_CLINIC_OR_DEPARTMENT_OTHER): Payer: Self-pay | Admitting: General Surgery

## 2021-08-30 ENCOUNTER — Other Ambulatory Visit: Payer: Self-pay

## 2021-08-30 NOTE — Progress Notes (Signed)
Spoke w/ via phone for pre-op interview---pt Lab needs dos---- none              Lab results------none COVID test -----patient states asymptomatic no test needed Arrive at -------1230 pm 09-01-2021 NPO after MN NO Solid Food.  Clear liquids from MN until---1130 am Med rec completed Medications to take morning of surgery -----prilosec, albuterol inhaler prn, bring inhaler Diabetic medication -----n/a Patient instructed no nail polish to be worn day of surgery Patient instructed to bring photo id and insurance card day of surgery Patient aware to have Driver (ride ) / caregiver    for 24 hours after surgery  wife denise Patient Special Instructions -----none Pre-Op special Istructions -----none Patient verbalized understanding of instructions that were given at this phone interview. Patient denies shortness of breath, chest pain, fever, cough at this phone interview.

## 2021-09-01 ENCOUNTER — Encounter (HOSPITAL_BASED_OUTPATIENT_CLINIC_OR_DEPARTMENT_OTHER)
Admission: RE | Disposition: A | Discharge status: Home / Self Care | Payer: Self-pay | Source: Home / Self Care | Attending: General Surgery

## 2021-09-01 ENCOUNTER — Ambulatory Visit (HOSPITAL_BASED_OUTPATIENT_CLINIC_OR_DEPARTMENT_OTHER): Payer: Federal, State, Local not specified - PPO | Admitting: Certified Registered"

## 2021-09-01 ENCOUNTER — Ambulatory Visit (HOSPITAL_BASED_OUTPATIENT_CLINIC_OR_DEPARTMENT_OTHER)
Admission: RE | Admit: 2021-09-01 | Discharge: 2021-09-01 | Disposition: A | Discharge status: Home / Self Care | Payer: Federal, State, Local not specified - PPO | Attending: General Surgery | Admitting: General Surgery

## 2021-09-01 ENCOUNTER — Encounter (HOSPITAL_BASED_OUTPATIENT_CLINIC_OR_DEPARTMENT_OTHER): Payer: Self-pay | Admitting: General Surgery

## 2021-09-01 DIAGNOSIS — K645 Perianal venous thrombosis: Secondary | ICD-10-CM | POA: Diagnosis not present

## 2021-09-01 DIAGNOSIS — K219 Gastro-esophageal reflux disease without esophagitis: Secondary | ICD-10-CM | POA: Insufficient documentation

## 2021-09-01 DIAGNOSIS — K640 First degree hemorrhoids: Secondary | ICD-10-CM | POA: Insufficient documentation

## 2021-09-01 DIAGNOSIS — J329 Chronic sinusitis, unspecified: Secondary | ICD-10-CM | POA: Diagnosis not present

## 2021-09-01 DIAGNOSIS — K589 Irritable bowel syndrome without diarrhea: Secondary | ICD-10-CM | POA: Diagnosis not present

## 2021-09-01 DIAGNOSIS — J45909 Unspecified asthma, uncomplicated: Secondary | ICD-10-CM | POA: Insufficient documentation

## 2021-09-01 DIAGNOSIS — G709 Myoneural disorder, unspecified: Secondary | ICD-10-CM | POA: Diagnosis not present

## 2021-09-01 HISTORY — DX: Presence of spectacles and contact lenses: Z97.3

## 2021-09-01 HISTORY — PX: HEMORRHOID SURGERY: SHX153

## 2021-09-01 HISTORY — DX: Spotted fever due to Rickettsia rickettsii: A77.0

## 2021-09-01 SURGERY — HEMORRHOIDECTOMY
Anesthesia: Monitor Anesthesia Care | Site: Rectum

## 2021-09-01 MED ORDER — SODIUM CHLORIDE 0.9% FLUSH: 3.0000 mL | Freq: Two times a day (BID) | INTRAVENOUS | Status: DC

## 2021-09-01 MED ORDER — MIDAZOLAM HCL 2 MG/2ML IJ SOLN
INTRAMUSCULAR | Status: AC
  Filled 2021-09-01: qty 2

## 2021-09-01 MED ORDER — HYDROCODONE-ACETAMINOPHEN 5-325 MG PO TABS: 1.0000 | ORAL_TABLET | Freq: Four times a day (QID) | ORAL | 0 refills | Status: DC | PRN

## 2021-09-01 MED ORDER — LACTATED RINGERS IV SOLN: INTRAVENOUS | Status: DC

## 2021-09-01 MED ORDER — PROPOFOL 1000 MG/100ML IV EMUL
INTRAVENOUS | Status: AC
  Filled 2021-09-01: qty 100

## 2021-09-01 MED ORDER — MIDAZOLAM HCL 2 MG/2ML IJ SOLN
INTRAMUSCULAR | Status: DC | PRN
  Administered 2021-09-01: 1 mg via INTRAVENOUS

## 2021-09-01 MED ORDER — PROPOFOL 500 MG/50ML IV EMUL
INTRAVENOUS | Status: DC | PRN
  Administered 2021-09-01: 150 ug/kg/min via INTRAVENOUS

## 2021-09-01 MED ORDER — FENTANYL CITRATE (PF) 100 MCG/2ML IJ SOLN
INTRAMUSCULAR | Status: DC | PRN
  Administered 2021-09-01: 25 ug via INTRAVENOUS

## 2021-09-01 MED ORDER — BUPIVACAINE-EPINEPHRINE 0.5% -1:200000 IJ SOLN
INTRAMUSCULAR | Status: DC | PRN
  Administered 2021-09-01: 30 mL

## 2021-09-01 MED ORDER — PROPOFOL 10 MG/ML IV BOLUS
INTRAVENOUS | Status: DC | PRN
  Administered 2021-09-01: 40 mg via INTRAVENOUS

## 2021-09-01 MED ORDER — BUPIVACAINE LIPOSOME 1.3 % IJ SUSP
INTRAMUSCULAR | Status: DC | PRN
  Administered 2021-09-01: 20 mL

## 2021-09-01 MED ORDER — GABAPENTIN 300 MG PO CAPS
300.0000 mg | ORAL_CAPSULE | ORAL | Status: AC
  Administered 2021-09-01: 12:00:00 300 mg via ORAL

## 2021-09-01 MED ORDER — ACETAMINOPHEN 500 MG PO TABS
ORAL_TABLET | ORAL | Status: AC
  Filled 2021-09-01: qty 2

## 2021-09-01 MED ORDER — CELECOXIB 200 MG PO CAPS
200.0000 mg | ORAL_CAPSULE | ORAL | Status: AC
  Administered 2021-09-01: 12:00:00 200 mg via ORAL

## 2021-09-01 MED ORDER — WHITE PETROLATUM EX OINT
TOPICAL_OINTMENT | CUTANEOUS | Status: AC
  Filled 2021-09-01: qty 5

## 2021-09-01 MED ORDER — LIDOCAINE 2% (20 MG/ML) 5 ML SYRINGE
INTRAMUSCULAR | Status: DC | PRN
  Administered 2021-09-01: 40 mg via INTRAVENOUS

## 2021-09-01 MED ORDER — FENTANYL CITRATE (PF) 100 MCG/2ML IJ SOLN
INTRAMUSCULAR | Status: AC
  Filled 2021-09-01: qty 2

## 2021-09-01 MED ORDER — CELECOXIB 200 MG PO CAPS
ORAL_CAPSULE | ORAL | Status: AC
  Filled 2021-09-01: qty 1

## 2021-09-01 MED ORDER — GABAPENTIN 300 MG PO CAPS
ORAL_CAPSULE | ORAL | Status: AC
  Filled 2021-09-01: qty 1

## 2021-09-01 MED ORDER — BUPIVACAINE LIPOSOME 1.3 % IJ SUSP: 20.0000 mL | Freq: Once | INTRAMUSCULAR | Status: DC

## 2021-09-01 MED ORDER — ACETAMINOPHEN 500 MG PO TABS
1000.0000 mg | ORAL_TABLET | ORAL | Status: AC
  Administered 2021-09-01: 12:00:00 1000 mg via ORAL

## 2021-09-01 SURGICAL SUPPLY — 40 items
BLADE EXTENDED COATED 6.5IN (ELECTRODE) IMPLANT
BLADE SURG 10 STRL SS (BLADE) IMPLANT
COVER BACK TABLE 60X90IN (DRAPES) ×2 IMPLANT
COVER MAYO STAND STRL (DRAPES) ×2 IMPLANT
DRAPE LAPAROTOMY 100X72 PEDS (DRAPES) ×2 IMPLANT
DRAPE UTILITY XL STRL (DRAPES) ×2 IMPLANT
DRSG PAD ABDOMINAL 8X10 ST (GAUZE/BANDAGES/DRESSINGS) ×2 IMPLANT
ELECT REM PT RETURN 9FT ADLT (ELECTROSURGICAL) ×2
ELECTRODE REM PT RTRN 9FT ADLT (ELECTROSURGICAL) ×1 IMPLANT
GAUZE 4X4 16PLY ~~LOC~~+RFID DBL (SPONGE) ×2 IMPLANT
GAUZE SPONGE 4X4 12PLY STRL (GAUZE/BANDAGES/DRESSINGS) ×1 IMPLANT
GLOVE SURG ENC MOIS LTX SZ6.5 (GLOVE) ×2 IMPLANT
GLOVE SURG NEOP MICRO LF SZ6.5 (GLOVE) ×2 IMPLANT
GLOVE SURG UNDER LTX SZ6.5 (GLOVE) ×2 IMPLANT
GLOVE SURG UNDER POLY LF SZ7.5 (GLOVE) ×1 IMPLANT
GOWN STRL REUS W/ TWL LRG LVL3 (GOWN DISPOSABLE) IMPLANT
GOWN STRL REUS W/TWL LRG LVL3 (GOWN DISPOSABLE) ×4
KIT SIGMOIDOSCOPE (SET/KITS/TRAYS/PACK) IMPLANT
KIT TURNOVER CYSTO (KITS) ×2 IMPLANT
NEEDLE HYPO 22GX1.5 SAFETY (NEEDLE) ×2 IMPLANT
NS IRRIG 500ML POUR BTL (IV SOLUTION) ×2 IMPLANT
PACK BASIN DAY SURGERY FS (CUSTOM PROCEDURE TRAY) ×2 IMPLANT
PAD ARMBOARD 7.5X6 YLW CONV (MISCELLANEOUS) IMPLANT
PANTS MESH DISP LRG (UNDERPADS AND DIAPERS) IMPLANT
PANTS MESH DISPOSABLE L (UNDERPADS AND DIAPERS) ×1
PENCIL SMOKE EVACUATOR (MISCELLANEOUS) ×2 IMPLANT
SPONGE HEMORRHOID 8X3CM (HEMOSTASIS) IMPLANT
SPONGE SURGIFOAM ABS GEL 100 (HEMOSTASIS) IMPLANT
SPONGE SURGIFOAM ABS GEL 12-7 (HEMOSTASIS) IMPLANT
SUT CHROMIC 2 0 SH (SUTURE) ×3 IMPLANT
SUT CHROMIC 3 0 SH 27 (SUTURE) ×3 IMPLANT
SUT VIC AB 2-0 SH 27 (SUTURE)
SUT VIC AB 2-0 SH 27XBRD (SUTURE) IMPLANT
SUT VIC AB 4-0 SH 18 (SUTURE) IMPLANT
SYR CONTROL 10ML LL (SYRINGE) ×2 IMPLANT
TOWEL OR 17X26 10 PK STRL BLUE (TOWEL DISPOSABLE) ×2 IMPLANT
TRAY DSU PREP LF (CUSTOM PROCEDURE TRAY) ×2 IMPLANT
TUBE CONNECTING 12X1/4 (SUCTIONS) ×2 IMPLANT
WATER STERILE IRR 500ML POUR (IV SOLUTION) IMPLANT
YANKAUER SUCT BULB TIP NO VENT (SUCTIONS) ×2 IMPLANT

## 2021-09-01 NOTE — Interval H&P Note (Signed)
History and Physical Interval Note:  09/01/2021 2:24 PM  George Garcia  has presented today for surgery, with the diagnosis of thrombosed hemorrhoid.  The various methods of treatment have been discussed with the patient and family. After consideration of risks, benefits and other options for treatment, the patient has consented to  Procedure(s): SINGLE COLUMN HEMORRHOIDECTOMY (N/A) as a surgical intervention.  The patient's history has been reviewed, patient examined, no change in status, stable for surgery.  I have reviewed the patient's chart and labs.  Questions were answered to the patient's satisfaction.     Vanita Panda, MD  Colorectal and General Surgery [facility]

## 2021-09-01 NOTE — Transfer of Care (Signed)
Immediate Anesthesia Transfer of Care Note  Patient: George Garcia  Procedure(s) Performed: Procedure(s) (LRB): SINGLE COLUMN HEMORRHOIDECTOMY (N/A)  Patient Location: PACU  Anesthesia Type: MAC  Level of Consciousness: awake, alert , oriented and patient cooperative  Airway & Oxygen Therapy: Patient Spontanous Breathing and Patient connected to face mask oxygen  Post-op Assessment: Report given to PACU RN and Post -op Vital signs reviewed and stable  Post vital signs: Reviewed and stable  Complications: No apparent anesthesia complications Last Vitals:  Vitals Value Taken Time  BP 131/85 09/01/21 1515  Temp 36.3 C 09/01/21 1510  Pulse 81 09/01/21 1516  Resp 23 09/01/21 1516  SpO2 100 % 09/01/21 1516  Vitals shown include unvalidated device data.  Last Pain:  Vitals:   09/01/21 1216  TempSrc: Oral  PainSc: 3       Patients Stated Pain Goal: 3 (73/40/37 0964)  Complications: No notable events documented.

## 2021-09-01 NOTE — Anesthesia Postprocedure Evaluation (Signed)
Anesthesia Post Note  Patient: George Garcia  Procedure(s) Performed: SINGLE COLUMN HEMORRHOIDECTOMY (Rectum)     Patient location during evaluation: PACU Anesthesia Type: MAC Level of consciousness: awake and alert and oriented Pain management: pain level controlled Vital Signs Assessment: post-procedure vital signs reviewed and stable Respiratory status: spontaneous breathing, nonlabored ventilation and respiratory function stable Cardiovascular status: stable and blood pressure returned to baseline Postop Assessment: no apparent nausea or vomiting Anesthetic complications: no   No notable events documented.  Last Vitals:  Vitals:   09/01/21 1515 09/01/21 1530  BP: 131/85   Pulse: 84   Resp: (!) 21   Temp:  (!) 36.3 C  SpO2: 100%     Last Pain:  Vitals:   09/01/21 1535  TempSrc:   PainSc: 0-No pain                 Lilliauna Van A.

## 2021-09-01 NOTE — Op Note (Signed)
09/01/2021  3:03 PM  PATIENT:  George Garcia  60 y.o. male  Patient Care Team: Ginger Organ as PCP - General (Physician Assistant) Eloise Harman, DO as Consulting Physician (Internal Medicine)  PRE-OPERATIVE DIAGNOSIS:  thrombosed hemorrhoid  POST-OPERATIVE DIAGNOSIS:  thrombosed hemorrhoid  PROCEDURE:  SINGLE COLUMN HEMORRHOIDECTOMY   Surgeon(s): Leighton Ruff, MD  ASSISTANT: none   ANESTHESIA:   local and MAC  SPECIMEN:  Source of Specimen:  thrombosed hemorrhoid   DISPOSITION OF SPECIMEN:  PATHOLOGY  COUNTS:  YES  PLAN OF CARE: Discharge to home after PACU  PATIENT DISPOSITION:  PACU - hemodynamically stable.  INDICATION: 60 y.o. M with R sided thrombosed hemorrhoid that persisted despite maximal medical treatment   OR FINDINGS: thrombosed R anterior hemorrhoid  DESCRIPTION: the patient was identified in the preoperative holding area and taken to the OR where they were laid on the operating room table.  MAC anesthesia was induced without difficulty. The patient was then positioned in prone jackknife position with buttocks gently taped apart.  The patient was then prepped and draped in usual sterile fashion.  SCDs were noted to be in place prior to the initiation of anesthesia. A surgical timeout was performed indicating the correct patient, procedure, positioning and need for preoperative antibiotics.  A rectal block was performed using Marcaine with epinephrine mixed with experel.    I began with a digital rectal exam.  There was no sphincter hypertension noted.  There were no internal masses noted.    I then placed a Hill-Ferguson anoscope into the anal canal and evaluated this completely.  The patient had grade 1 internal hemorrhoids.  I began by incising the skin around the hemorrhoid using Metzenbaum scissors.  Dissection was carried underneath the thrombosis in the subcutaneous tissues.  The sphincter complex was identified and preserved.  The entire  hemorrhoidal column was removed.  An additional thrombosis was resected from the underlying skin using Metzenbaum scissors.  The proximal portion of the incision was closed using a running 2-0 chromic suture.  The portion of the skin distal to the dentate line was closed using a running 3-0 chromic suture.  Hemostasis was good.  Additional Marcaine was placed around the incision for added postoperative pain control.  A sterile dressing was applied.  The patient was awakened from anesthesia and sent to the postanesthesia care unit in stable condition.  All counts were correct per operating room staff.

## 2021-09-01 NOTE — Discharge Instructions (Addendum)
ANORECTAL SURGERY: POST OP INSTRUCTIONS Take your usually prescribed home medications unless otherwise directed. DIET: During the first few hours after surgery sip on some liquids until you are able to urinate.  It is normal to not urinate for several hours after this surgery.  If you feel uncomfortable, please contact the office for instructions.  After you are able to urinate,you may eat, if you feel like it.  Follow a light bland diet the first 24 hours after arrival home, such as soup, liquids, crackers, etc.  Be sure to include lots of fluids daily (6-8 glasses).  Avoid fast food or heavy meals, as your are more likely to get nauseated.  Eat a low fat diet the next few days after surgery.  Limit caffeine intake to 1-2 servings a day. PAIN CONTROL: Pain is best controlled by a usual combination of several different methods TOGETHER: Muscle relaxation: Soak in a warm bath (or Sitz bath) three times a day and after bowel movements.  Continue to do this until all pain is resolved. Over the counter pain medication Prescription pain medication Most patients will experience some swelling and discomfort in the anus/rectal area and incisions.  Heat such as warm towels, sitz baths, warm baths, etc to help relax tight/sore spots and speed recovery.  Some people prefer to use ice, especially in the first couple days after surgery, as it may decrease the pain and swelling, or alternate between ice & heat.  Experiment to what works for you.  Swelling and bruising can take several weeks to resolve.  Pain can take even longer to completely resolve. It is helpful to take an over-the-counter pain medication regularly for the first few weeks.  Choose one of the following that works best for you: Naproxen (Aleve, etc)  Two 220mg tabs twice a day Ibuprofen (Advil, etc) Three 200mg tabs four times a day (every meal & bedtime) A  prescription for pain medication (such as percocet, oxycodone, hydrocodone, etc) should be  given to you upon discharge.  Take your pain medication as prescribed.  If you are having problems/concerns with the prescription medicine (does not control pain, nausea, vomiting, rash, itching, etc), please call us [phone] to see if we need to switch you to a different pain medicine that will work better for you and/or control your side effect better. If you need a refill on your pain medication, please contact your pharmacy.  They will contact our office to request authorization. Prescriptions will not be filled after 5 pm or on week-ends. KEEP YOUR BOWELS REGULAR and AVOID CONSTIPATION The goal is one to two soft bowel movements a day.  You should at least have a bowel movement every other day. Avoid getting constipated.  Between the surgery and the pain medications, it is common to experience some constipation. This can be very painful after rectal surgery.  Increasing fluid intake and taking a fiber supplement (such as Metamucil, Citrucel, FiberCon, etc) 1-2 times a day regularly will usually help prevent this problem from occurring.  A stool softener like colace is also recommended.  This can be purchased over the counter at your pharmacy.  You can take it up to 3 times a day.  If you do not have a bowel movement after 24 hrs since your surgery, take one does of milk of magnesia.  If you still haven't had a bowel movement 8-12 hours after that dose, take another dose.  If you don't have a bowel movement 48 hrs after surgery,   purchase a Fleets enema from the drug store and administer gently per package instructions.  If you still are having trouble with your bowel movements after that, please call the office for further instructions. If you develop diarrhea or have many loose bowel movements, simplify your diet to bland foods & liquids for a few days.  Stop any stool softeners and decrease your fiber supplement.  Switching to mild anti-diarrheal medications (Kayopectate, Pepto Bismol) can help.   If this worsens or does not improve, please call us.  Wound Care Remove your bandages before your first bowel movement or 8 hours after surgery.     Remove any wound packing material at this tim,e as well.  You do not need to repack the wound unless instructed otherwise.  Wear an absorbent pad or soft cotton gauze in your underwear to catch any drainage and help keep the area clean. You should change this every 2-3 hours while awake. Keep the area clean and dry.  Bathe / shower every day, especially after bowel movements.  Keep the area clean by showering / bathing over the incision / wound.   It is okay to soak an open wound to help wash it.  Wet wipes or showers / gentle washing after bowel movements is often less traumatic than regular toilet paper. You may have some styrofoam-like soft packing in the rectum which will come out with the first bowel movement.  You will often notice bleeding with bowel movements.  This should slow down by the end of the first week of surgery Expect some drainage.  This should slow down, too, by the end of the first week of surgery.  Wear an absorbent pad or soft cotton gauze in your underwear until the drainage stops. Do Not sit on a rubber or pillow ring.  This can make you symptoms worse.  You may sit on a soft pillow if needed.  ACTIVITIES as tolerated:   You may resume regular (light) daily activities beginning the next day--such as daily self-care, walking, climbing stairs--gradually increasing activities as tolerated.  If you can walk 30 minutes without difficulty, it is safe to try more intense activity such as jogging, treadmill, bicycling, low-impact aerobics, swimming, etc. Save the most intensive and strenuous activity for last such as sit-ups, heavy lifting, contact sports, etc  Refrain from any heavy lifting or straining until you are off narcotics for pain control.   You may drive when you are no longer taking prescription pain medication, you can  comfortably sit for long periods of time, and you can safely maneuver your car and apply brakes. You may have sexual intercourse when it is comfortable.  FOLLOW UP in our office Please call [facility] at [phone] to set up an appointment to see your surgeon in the office for a follow-up appointment approximately 3-4 weeks after your surgery. Make sure that you call for this appointment the day you arrive home to insure a convenient appointment time. 10. IF YOU HAVE DISABILITY OR FAMILY LEAVE FORMS, BRING THEM TO THE OFFICE FOR PROCESSING.  DO NOT GIVE THEM TO YOUR DOCTOR.     WHEN TO CALL US [phone]: Poor pain control Reactions / problems with new medications (rash/itching, nausea, etc)  Fever over 101.5 F (38.5 C) Inability to urinate Nausea and/or vomiting Worsening swelling or bruising Continued bleeding from incision. Increased pain, redness, or drainage from the incision  The clinic staff is available to answer your questions during regular business hours (8:30am-5pm).    Please dont hesitate to call and ask to speak to one of our nurses for clinical concerns.   A surgeon from [facility] is always on call at the hospitals   If you have a medical emergency, go to the nearest emergency room or call 911.    [facility] [address], [place], [place]  [address] ? MAIN: [phone] ? TOLL FREE: [phone] ? FAX [phone] [email or web address]    Post Anesthesia Home Care Instructions  Activity: Get plenty of rest for the remainder of the day. A responsible individual must stay with you for 24 hours following the procedure.  For the next 24 hours, DO NOT: -Drive a car -Advertising copywriter -Drink alcoholic beverages -Take any medication unless instructed by your physician -Make any legal decisions or sign important papers.  Meals: Start with liquid foods such as gelatin or soup. Progress to regular foods  as tolerated. Avoid greasy, spicy, heavy foods. If nausea and/or vomiting occur, drink only clear liquids until the nausea and/or vomiting subsides. Call your physician if vomiting continues.  Special Instructions/Symptoms: Your throat may feel dry or sore from the anesthesia or the breathing tube placed in your throat during surgery. If this causes discomfort, gargle with warm salt water. The discomfort should disappear within 24 hours.  No acetaminophen/Tylenol until after 6:30 pm today if needed. No ibuprofen, Advil, Aleve, Motrin, ketorolac, meloxicam, naproxen, or other NSAIDS until after 6:30 pm today if needed.  Information for Discharge Teaching: EXPAREL (bupivacaine liposome injectable suspension)   Your surgeon or anesthesiologist gave you EXPAREL(bupivacaine) to help control your pain after surgery.  EXPAREL is a local anesthetic that provides pain relief by numbing the tissue around the surgical site. EXPAREL is designed to release pain medication over time and can control pain for up to 72 hours. Depending on how you respond to EXPAREL, you may require less pain medication during your recovery.  Possible side effects: Temporary loss of sensation or ability to move in the area where bupivacaine was injected. Nausea, vomiting, constipation Rarely, numbness and tingling in your mouth or lips, lightheadedness, or anxiety may occur. Call your doctor right away if you think you may be experiencing any of these sensations, or if you have other questions regarding possible side effects.  Follow all other discharge instructions given to you by your surgeon or nurse. Eat a healthy diet and drink plenty of water or other fluids.  If you return to the hospital for any reason within 96 hours following the administration of EXPAREL, it is important for health care providers to know that you have received this anesthetic. A teal colored band has been placed on your arm with the date, time and  amount of EXPAREL you have received in order to alert and inform your health care providers. Please leave this armband in place for the full 96 hours following administration, and then you may remove the band.

## 2021-09-01 NOTE — Anesthesia Preprocedure Evaluation (Addendum)
Anesthesia Evaluation  Patient identified by MRN, date of birth, ID band Patient awake    Reviewed: Allergy & Precautions, NPO status , Patient's Chart, lab work & pertinent test results  Airway Mallampati: II  TM Distance: >3 FB     Dental   Pulmonary asthma ,    breath sounds clear to auscultation       Cardiovascular negative cardio ROS   Rhythm:Regular Rate:Normal     Neuro/Psych  Neuromuscular disease    GI/Hepatic Neg liver ROS, GERD  ,  Endo/Other  negative endocrine ROS  Renal/GU negative Renal ROS     Musculoskeletal   Abdominal   Peds  Hematology   Anesthesia Other Findings   Reproductive/Obstetrics                            Anesthesia Physical Anesthesia Plan  ASA: 3  Anesthesia Plan: MAC   Post-op Pain Management:    Induction: Intravenous  PONV Risk Score and Plan: 2 and Ondansetron, Dexamethasone and Midazolam  Airway Management Planned: Nasal Cannula and Simple Face Mask  Additional Equipment:   Intra-op Plan:   Post-operative Plan:   Informed Consent: I have reviewed the patients History and Physical, chart, labs and discussed the procedure including the risks, benefits and alternatives for the proposed anesthesia with the patient or authorized representative who has indicated his/her understanding and acceptance.     Dental advisory given  Plan Discussed with: CRNA and Anesthesiologist  Anesthesia Plan Comments:        Anesthesia Quick Evaluation

## 2021-09-02 NOTE — Anesthesia Postprocedure Evaluation (Signed)
Anesthesia Post Note  Patient: George Garcia  Procedure(s) Performed: SINGLE COLUMN HEMORRHOIDECTOMY (Rectum)     Anesthesia Post Evaluation No notable events documented.  Last Vitals:  Vitals:   09/01/21 1530 09/01/21 1640  BP:  (!) 139/93  Pulse:  62  Resp:  14  Temp: (!) 36.3 C (!) 36.3 C  SpO2:  100%    Last Pain:  Vitals:   09/02/21 1151  TempSrc:   PainSc: 7                  Winna Golla

## 2021-09-03 ENCOUNTER — Encounter (HOSPITAL_BASED_OUTPATIENT_CLINIC_OR_DEPARTMENT_OTHER): Payer: Self-pay | Admitting: General Surgery

## 2021-09-06 ENCOUNTER — Telehealth: Payer: Self-pay | Admitting: Gastroenterology

## 2021-09-09 LAB — SURGICAL PATHOLOGY

## 2021-09-16 NOTE — Telephone Encounter (Signed)
Hi Dr. Russella Dar,   D.O.D  We received a referral for patient to be seen for rectal bleeding. He has records available in Epic from [facility] according to their notes patient has had EGD and colonoscopy in the past. With history of IBS and Gerd. Colonoscopy being done about two years ago with a recall in 10 yrs that was done in [place] not able to obtain those records.   Please review and advise on scheduling.   Thanks

## 2021-10-13 ENCOUNTER — Encounter: Payer: Self-pay | Admitting: Internal Medicine

## 2021-10-13 ENCOUNTER — Ambulatory Visit (INDEPENDENT_AMBULATORY_CARE_PROVIDER_SITE_OTHER): Payer: Federal, State, Local not specified - PPO | Admitting: Internal Medicine

## 2021-10-13 VITALS — BP 130/82 | HR 67 | Ht 70.0 in | Wt 186.0 lb

## 2021-10-13 DIAGNOSIS — K589 Irritable bowel syndrome without diarrhea: Secondary | ICD-10-CM | POA: Diagnosis not present

## 2021-10-13 DIAGNOSIS — K649 Unspecified hemorrhoids: Secondary | ICD-10-CM | POA: Diagnosis not present

## 2021-10-13 MED ORDER — POLYETHYLENE GLYCOL 3350 17 G PO PACK: 17.0000 g | PACK | Freq: Every day | ORAL | 0 refills | Status: DC

## 2021-10-13 MED ORDER — BENEFIBER PO POWD: ORAL | 0 refills | Status: DC

## 2021-10-13 NOTE — Patient Instructions (Addendum)
If you are age 60 or older, your body mass index should be between 23-30. Your Body mass index is 26.69 kg/m. If this is out of the aforementioned range listed, please consider follow up with your Primary Care Provider.  If you are age 60 or younger, your body mass index should be between 19-25. Your Body mass index is 26.69 kg/m. If this is out of the aformentioned range listed, please consider follow up with your Primary Care Provider.   ________________________________________________________  The [facility] providers would like to encourage you to use [facility] to communicate with providers for non-urgent requests or questions.  Due to long hold times on the telephone, sending your provider a message by [facility] may be a faster and more efficient way to get a response.  Please allow 48 business hours for a response.  Please remember that this is for non-urgent requests.  _______________________________________________________  Low-FODMAP Eating Plan FODMAP stands for fermentable oligosaccharides, disaccharides, monosaccharides, and polyols. These are sugars that are hard for some people to digest. A low-FODMAP eating plan may help some people who have irritable bowel syndrome (IBS) and certain other bowel (intestinal) diseases to manage their symptoms. This meal plan can be complicated to follow. Work with a diet and nutrition specialist (dietitian) to make a low-FODMAP eating plan that is right for you. A dietitian can help make sure that you get enough nutrition from this diet. What are tips for following this plan? Reading food labels Check labels for hidden FODMAPs such as: High-fructose syrup. Honey. Agave. Natural fruit flavors. Onion or garlic powder. Choose low-FODMAP foods that contain 3-4 grams of fiber per serving. Check food labels for serving sizes. Eat only one serving at a time to make sure FODMAP levels stay low. Shopping Shop with a list of foods that are recommended on this  diet and make a meal plan. Meal planning Follow a low-FODMAP eating plan for up to 6 weeks, or as told by your health care provider or dietitian. To follow the eating plan: Eliminate high-FODMAP foods from your diet completely. Choose only low-FODMAP foods to eat. You will do this for 2-6 weeks. Gradually reintroduce high-FODMAP foods into your diet one at a time. Most people should wait a few days before introducing the next new high-FODMAP food into their meal plan. Your dietitian can recommend how quickly you may reintroduce foods. Keep a daily record of what and how much you eat and drink. Make note of any symptoms that you have after eating. Review your daily record with a dietitian regularly to identify which foods you can eat and which foods you should avoid. General tips Drink enough fluid each day to keep your urine pale yellow. Avoid processed foods. These often have added sugar and may be high in FODMAPs. Avoid most dairy products, whole grains, and sweeteners. Work with a dietitian to make sure you get enough fiber in your diet. Avoid high FODMAP foods at meals to manage symptoms. Recommended foods Fruits Bananas, oranges, tangerines, lemons, limes, blueberries, raspberries, strawberries, grapes, cantaloupe, honeydew melon, kiwi, papaya, passion fruit, and pineapple. Limited amounts of dried cranberries, banana chips, and shredded coconut. Vegetables Eggplant, zucchini, cucumber, peppers, green beans, bean sprouts, lettuce, arugula, kale, Swiss chard, spinach, collard greens, bok choy, summer squash, potato, and tomato. Limited amounts of corn, carrot, and sweet potato. Green parts of scallions. Grains Gluten-free grains, such as rice, oats, buckwheat, quinoa, corn, polenta, and millet. Gluten-free pasta, bread, or cereal. Rice noodles. Corn tortillas. Meats and  other proteins Unseasoned beef, pork, poultry, or fish. Eggs. Tomasa Blase. Tofu (firm) and tempeh. Limited amounts of nuts and  seeds, such as almonds, walnuts, [place] nuts, pecans, peanuts, nut butters, pumpkin seeds, chia seeds, and sunflower seeds. Dairy Lactose-free milk, yogurt, and kefir. Lactose-free cottage cheese and ice cream. Non-dairy milks, such as almond, coconut, hemp, and rice milk. Non-dairy yogurt. Limited amounts of goat cheese, brie, mozzarella, parmesan, swiss, and other hard cheeses. Fats and oils Butter-free spreads. Vegetable oils, such as olive, canola, and sunflower oil. Seasoning and other foods Artificial sweeteners with names that do not end in "ol," such as aspartame, saccharine, and stevia. Maple syrup, white table sugar, raw sugar, brown sugar, and molasses. Mayonnaise, soy sauce, and tamari. Fresh basil, coriander, parsley, rosemary, and thyme. Beverages Water and mineral water. Sugar-sweetened soft drinks. Small amounts of orange juice or cranberry juice. Black and green tea. Most dry wines. Coffee. The items listed above may not be a complete list of foods and beverages you can eat. Contact a dietitian for more information. Foods to avoid Fruits Fresh, dried, and juiced forms of apple, pear, watermelon, peach, plum, cherries, apricots, blackberries, boysenberries, figs, nectarines, and mango. Avocado. Vegetables Chicory root, artichoke, asparagus, cabbage, snow peas, Brussels sprouts, broccoli, sugar snap peas, mushrooms, celery, and cauliflower. Onions, garlic, leeks, and the white part of scallions. Grains Wheat, including kamut, durum, and semolina. Barley and bulgur. Couscous. Wheat-based cereals. Wheat noodles, bread, crackers, and pastries. Meats and other proteins Fried or fatty meat. Sausage. Cashews and pistachios. Soybeans, baked beans, black beans, chickpeas, kidney beans, fava beans, navy beans, lentils, black-eyed peas, and split peas. Dairy Milk, yogurt, ice cream, and soft cheese. Cream and sour cream. Milk-based sauces. Custard. Buttermilk. Soy milk. Seasoning and other  foods Any sugar-free gum or candy. Foods that contain artificial sweeteners such as sorbitol, mannitol, isomalt, or xylitol. Foods that contain honey, high-fructose corn syrup, or agave. Bouillon, vegetable stock, beef stock, and chicken stock. Garlic and onion powder. Condiments made with onion, such as hummus, chutney, pickles, relish, salad dressing, and salsa. Tomato paste. Beverages Chicory-based drinks. Coffee substitutes. Chamomile tea. Fennel tea. Sweet or fortified wines such as port or sherry. Diet soft drinks made with isomalt, mannitol, maltitol, sorbitol, or xylitol. Apple, pear, and mango juice. Juices with high-fructose corn syrup. The items listed above may not be a complete list of foods and beverages you should avoid. Contact a dietitian for more information. Summary FODMAP stands for fermentable oligosaccharides, disaccharides, monosaccharides, and polyols. These are sugars that are hard for some people to digest. A low-FODMAP eating plan is a short-term diet that helps to ease symptoms of certain bowel diseases. The eating plan usually lasts up to 6 weeks. After that, high-FODMAP foods are reintroduced gradually and one at a time. This can help you find out which foods may be causing symptoms. A low-FODMAP eating plan can be complicated. It is best to work with a dietitian who has experience with this type of plan. This information is not intended to replace advice given to you by your health care provider. Make sure you discuss any questions you have with your health care provider. Document Revised: 01/23/2020 Document Reviewed: 01/23/2020 Elsevier Patient Education  2022 [employer] Inc.  _________________________________________________________________________________________________  Drink 8 cups of water a day.  Please purchase the following medications over the counter and take as directed: Fiber supplement such as Benefiber- use as directed daily Miralax: Take as   directed, up to 3 times a day, to achieve  regular bowel movements. (Note: Discontinue lactulose)  We have scheduled you for a follow up appointment on Wednesday, March 1st at 9:30 am.  Thank you for entrusting me with your care and for choosing [facility], Dr. Eulah Pont

## 2021-10-13 NOTE — Progress Notes (Signed)
Chief Complaint: IBS and rectal bleeding  HPI : 61 year old male with history of asthma, GERD, IBS presents with IBS and rectal bleeding  He has been on lactulose for 10-12 years for constipation, but he has been having issues with bloating on the lactulose therapy. Thus he wants to know if there are any additional options for laxatives.  Patient would prefer to be placed on medication that is a generic covered by his insurance.  He has alternating constipation and diarrhea, and he only takes the lactulose as needed. Prior to the lactulose, he was tried on multiple laxatives, but he has had issues with the laxatives he was taken in the past.  He is to have issues with rectal bleeding, but he recently had surgery for his hemorrhoids on 09/01/2021, which has resolved his rectal bleeding.  His last colonoscopy was in 2019 and was normal. He states that he due for another colonoscopy in 10 years. Denies dysphagia, N&V, weight loss, ab pain. Denies following any particular diet. He used to be on Miralax but this caused issues with bloating. He tried magnesium citrate that did not help. His GERD is under good control on PPI BID therapy.  He previously followed with [facility].  Has previously been tried on dicyclomine therapy.  Has previously been noted to have elevated LFTs, though I have no LFTs available for review.  Past Medical History:  Diagnosis Date   Allergy    Asthma    GERD (gastroesophageal reflux disease)    IBS (irritable bowel syndrome)    [facility] spotted fever    2020 and 2021   Wears glasses    Past Surgical History:  Procedure Laterality Date   colonscopy  2019   HEMORRHOID SURGERY N/A 09/01/2021   Procedure: SINGLE COLUMN HEMORRHOIDECTOMY;  Surgeon: Leighton Ruff, MD;  Location: [facility];  Service: General;  Laterality: N/A;   SHOULDER SURGERY Right 2007   rotator cuff repair   Family History  Problem Relation Age of Onset   Stroke Mother     Diabetes Mother    Stroke Father    Kidney disease Sister    Kidney disease Brother    Fibroids Daughter    Healthy Daughter    Healthy Daughter    Healthy Son    Colon cancer Neg Hx    Cancer - Colon Neg Hx    Rectal cancer Neg Hx    Stomach cancer Neg Hx    Esophageal cancer Neg Hx    Social History   Tobacco Use   Smoking status: Never   Smokeless tobacco: Never  Vaping Use   Vaping Use: Never used  Substance Use Topics   Alcohol use: Never   Drug use: Never   Current Outpatient Medications  Medication Sig Dispense Refill   albuterol (VENTOLIN HFA) 108 (90 Base) MCG/ACT inhaler Inhale 2 puffs into the lungs every 6 (six) hours as needed for wheezing or shortness of breath. 18 g 3   Camphor-Menthol-Methyl Sal (HM SALONPAS PAIN RELIEF) 1.2-5.7-6.3 % PTCH Apply 1 patch topically daily as needed. 40 patch 11   cyclobenzaprine (FLEXERIL) 10 MG tablet Take 1 tablet (10 mg total) by mouth 3 (three) times daily as needed for muscle spasms. 30 tablet 1   Diclofenac Sodium (PENNSAID) 2 % SOLN Apply topically daily as needed.     dicyclomine (BENTYL) 20 MG tablet TAKE 1 TABLET (20 MG TOTAL) BY MOUTH EVERY 6 (SIX) HOURS. (Patient taking differently: Take  20 mg by mouth 2 (two) times daily.) 90 tablet 3   fluticasone (FLONASE) 50 MCG/ACT nasal spray PLACE 1 SPRAY INTO BOTH NOSTRILS 2 (TWO) TIMES DAILY AS NEEDED FOR ALLERGIES OR RHINITIS. 48 mL 1   HYDROcodone-acetaminophen (NORCO/VICODIN) 5-325 MG tablet Take 1-2 tablets by mouth every 6 (six) hours as needed. 30 tablet 0   hydrocortisone (ANUSOL-HC) 25 MG suppository Place 25 mg rectally 3 (three) times daily.     Ibuprofen-Famotidine (DUEXIS) 800-26.6 MG TABS Take by mouth as needed.     lactulose (CHRONULAC) 10 GM/15ML solution TAKE 30 MLS (20 G TOTAL) BY MOUTH 2 (TWO) TIMES DAILY. 1892 mL 2   lidocaine (LIDODERM) 5 % 1 patch as needed.     montelukast (SINGULAIR) 10 MG tablet TAKE 1 TABLET BY MOUTH EVERYDAY AT BEDTIME 90 tablet 0    omeprazole (PRILOSEC) 40 MG capsule Take 1 capsule (40 mg total) by mouth 2 (two) times daily before a meal. 180 capsule 3   No current facility-administered medications for this visit.   No Known Allergies   Review of Systems: All systems reviewed and negative except where noted in HPI.   Physical Exam: BP 130/82    Pulse 67    Ht 5\' 10"  (1.778 m)    Wt 186 lb (84.4 kg)    SpO2 98%    BMI 26.69 kg/m  Constitutional: Pleasant,well-developed, male in no acute distress. HEENT: Normocephalic and atraumatic. Conjunctivae are normal. No scleral icterus. Cardiovascular: Normal rate, regular rhythm.  Pulmonary/chest: Effort normal and breath sounds normal. No wheezing, rales or rhonchi. Abdominal: Soft, nondistended, nontender. Bowel sounds active throughout. There are no masses palpable. No hepatomegaly. Extremities: No edema Neurological: Alert and oriented to person place and time. Skin: Skin is warm and dry. No rashes noted. Psychiatric: Normal mood and affect. Behavior is normal.  Labs 04/2020: Ferritin 42, iron sat 51%.  Hep B surface antigen negative.  Hep B core antibody negative.  Hep B surface antibody negative.  Hep C antibody negative.  ASSESSMENT AND PLAN: IBS Hemorrhoids Patient presents with longstanding issues with IBS where he has alternating constipation and diarrhea.  He would like to try an alternative laxative regimen since he is currently on lactulose and has experienced excessive bloating issues.  We will institute a low FODMAP diet and attempt to help his constipation by starting a daily fiber supplement and using MiraLAX therapy.  Although patient has been on MiraLAX in the past, I reassured him that MiraLAX will create less bloating issues compared to lactulose therapy. - Low FODMAP diet - Drink 8 cups of water per day - Start daily fiber supplement - Replace lactulose with Miralax.  Can take MiraLAX up to 3 times a day to achieve 1 good bowel movement per day. -  RTC in 1 month. If Miralax is still not effective, then will try to find a medication such Linzess that is covered by his insurance.  We will plan to check his LFTs at that time patient reportedly has history of elevated LFTs.  Christia Reading, MD  I spent 62 minutes of time, including in depth chart review, independent review of results as outlined above, communicating results with the patient directly, face-to-face time with the patient, coordinating care, ordering studies and medications as appropriate, and documentation.

## 2021-11-17 ENCOUNTER — Ambulatory Visit: Payer: Federal, State, Local not specified - PPO | Admitting: Internal Medicine

## 2021-11-17 ENCOUNTER — Encounter: Payer: Self-pay | Admitting: Internal Medicine

## 2021-11-17 VITALS — BP 124/76 | HR 57 | Ht 70.0 in | Wt 186.0 lb

## 2021-11-17 DIAGNOSIS — K5900 Constipation, unspecified: Secondary | ICD-10-CM | POA: Diagnosis not present

## 2021-11-17 MED ORDER — LACTULOSE 10 GM/15ML PO SOLN: 20.0000 g | Freq: Two times a day (BID) | ORAL | 6 refills | Status: AC

## 2021-11-17 NOTE — Progress Notes (Signed)
? ?Chief Complaint: IBS and rectal bleeding ? ?HPI : 61 year old male with history of asthma, GERD, IBS presents with IBS and rectal bleeding ? ?Interval History: He tried the fiber and Miralax.  The MiraLAX and fiber appeared to help a little with his constipation, but he noted that he was getting progressively more constipated over time.  At one point he started getting nauseous because he was not going to the bathroom.  Thus he restarted his lactulose therapy.  He has not seen as many issues with bloating after having restarted the lactulose.  He is currently taking his lactulose twice daily, and feels that this is controlling his constipation well.  He is on the keto diet currently.  Has been drinking plenty of water.  Did look over the low FODMAP diet.  He has had no issues with his hemorrhoids after his hemorrhoidectomy.  Only had pain for about a week after his surgery. ? ?Current Outpatient Medications  ?Medication Sig Dispense Refill  ? albuterol (VENTOLIN HFA) 108 (90 Base) MCG/ACT inhaler Inhale 2 puffs into the lungs every 6 (six) hours as needed for wheezing or shortness of breath. 18 g 3  ? Camphor-Menthol-Methyl Sal (HM SALONPAS PAIN RELIEF) 1.2-5.7-6.3 % PTCH Apply 1 patch topically daily as needed. 40 patch 11  ? Diclofenac Sodium (PENNSAID) 2 % SOLN Apply topically daily as needed.    ? dicyclomine (BENTYL) 20 MG tablet TAKE 1 TABLET (20 MG TOTAL) BY MOUTH EVERY 6 (SIX) HOURS. (Patient taking differently: Take 20 mg by mouth 2 (two) times daily.) 90 tablet 3  ? fluticasone (FLONASE) 50 MCG/ACT nasal spray PLACE 1 SPRAY INTO BOTH NOSTRILS 2 (TWO) TIMES DAILY AS NEEDED FOR ALLERGIES OR RHINITIS. 48 mL 1  ? Ibuprofen-Famotidine (DUEXIS) 800-26.6 MG TABS Take by mouth as needed.    ? lidocaine (LIDODERM) 5 % 1 patch as needed.    ? montelukast (SINGULAIR) 10 MG tablet TAKE 1 TABLET BY MOUTH EVERYDAY AT BEDTIME 90 tablet 0  ? omeprazole (PRILOSEC) 40 MG capsule Take 1 capsule (40 mg total) by mouth 2  (two) times daily before a meal. 180 capsule 3  ? ?No current facility-administered medications for this visit.  ? ?Review of Systems: ?All systems reviewed and negative except where noted in HPI.  ? ?Physical Exam: ?BP 124/76   Pulse (!) 57   Ht 5\' 10"  (1.778 m)   Wt 186 lb (84.4 kg)   BMI 26.69 kg/m?  ?Constitutional: Pleasant,well-developed, male in no acute distress. ?HEENT: Normocephalic and atraumatic. Conjunctivae are normal. No scleral icterus. ?Cardiovascular: Normal rate, regular rhythm.  ?Pulmonary/chest: Effort normal and breath sounds normal. No wheezing, rales or rhonchi. ?Abdominal: Soft, nondistended, nontender. Bowel sounds active throughout. There are no masses palpable. No hepatomegaly. ?Extremities: No edema ?Neurological: Alert and oriented to person place and time. ?Skin: Skin is warm and dry. No rashes noted. ?Psychiatric: Normal mood and affect. Behavior is normal. ? ?Labs 04/2020: Ferritin 42, iron sat 51%.  Hep B surface antigen negative.  Hep B core antibody negative.  Hep B surface antibody negative.  Hep C antibody negative. ? ?ASSESSMENT AND PLAN: ?Constipation ?IBS ?Hemorrhoids s/p hemorrhoidectomy ?Patient had difficulty having stools on MiraLAX and fiber therapy.  He restarted his lactulose therapy, which she was on previously, and has had good effects from this.  Thus we will plan to keep him on lactulose therapy at this time.  He can use Gas-X as needed to help with bloating issues, and can decide  if fiber will help with his bowel habits in addition to the lactulose. ?- Previously gave information on low FODMAP diet and instructed to drink more water ?- Restart lactulose BID ?- Use Gas-X PRN and can continue to try fiber ?- RTC PRN ?- Consider Linzess in the future if patient has recurrent issues with constipation ? ?Eulah Pont, MD ? ? ?

## 2021-11-17 NOTE — Patient Instructions (Signed)
If you are age 60 or younger, your body mass index should be between 19-25. Your Body mass index is 26.69 kg/m?Marland Kitchen If this is out of the aformentioned range listed, please consider follow up with your Primary Care Provider.  ?________________________________________________________ ? ?The [facility] providers would like to encourage you to use [facility] to communicate with providers for non-urgent requests or questions.  Due to long hold times on the telephone, sending your provider a message by [facility] may be a faster and more efficient way to get a response.  Please allow 48 business hours for a response.  Please remember that this is for non-urgent requests.  ?_______________________________________________________ ? ?Restart Lactulose 20 grams twice daily. ? ?You may do a trial of Fiber (Metamucil) ? ?User Gas-X as needed. ? ?Follow up as needed for now. ? ?Thank you for entrusting me with your care and choosing [facility]. ? ?Dr Leonides Schanz ?

## 2022-01-06 DIAGNOSIS — E663 Overweight: Secondary | ICD-10-CM | POA: Diagnosis not present

## 2022-01-06 DIAGNOSIS — Z6826 Body mass index (BMI) 26.0-26.9, adult: Secondary | ICD-10-CM | POA: Diagnosis not present

## 2022-01-06 DIAGNOSIS — J3089 Other allergic rhinitis: Secondary | ICD-10-CM | POA: Diagnosis not present

## 2022-01-10 ENCOUNTER — Ambulatory Visit: Payer: Federal, State, Local not specified - PPO | Admitting: Family Medicine

## 2022-01-10 ENCOUNTER — Encounter: Payer: Self-pay | Admitting: Family Medicine

## 2022-01-10 DIAGNOSIS — R053 Chronic cough: Secondary | ICD-10-CM

## 2022-01-10 MED ORDER — BENZONATATE 200 MG PO CAPS: 200.0000 mg | ORAL_CAPSULE | Freq: Two times a day (BID) | ORAL | 0 refills | Status: DC | PRN

## 2022-01-10 MED ORDER — PREDNISONE 20 MG PO TABS: ORAL_TABLET | ORAL | 0 refills | Status: DC

## 2022-01-10 MED ORDER — AZITHROMYCIN 250 MG PO TABS: ORAL_TABLET | ORAL | 0 refills | Status: DC

## 2022-01-10 NOTE — Progress Notes (Signed)
? ?Virtual Visit via telephone Note ? ?I connected with George Garcia on 01/10/22 at 1308 by telephone and verified that I am speaking with the correct person using two identifiers. George Garcia is currently located at home and patient are currently with her during visit. The provider, Elige Radon Vallen Calabrese, MD is located in their office at time of visit. ? ?Call ended at 1315 ? ?I discussed the limitations, risks, security and privacy concerns of performing an evaluation and management service by telephone and the availability of in person appointments. I also discussed with the patient that there may be a patient responsible charge related to this service. The patient expressed understanding and agreed to proceed. ? ? ?History and Present Illness: ?Patient is calling in for sinus pressure and it has been going on for 1 month.  He has developed a cough. He got an injection of steroid and he said sinuses are better. He has mostly dry cough but denies SOB or wheezing. The cough has been going on for 3 weeks. He stiil takes singulair and other allergy medicines. He denies sick contacts that she knows. He has been taking dayquil and is not helping with cough.  ? ?1. Persistent cough   ? ? ?Outpatient Encounter Medications as of 01/10/2022  ?Medication Sig  ? azithromycin (ZITHROMAX) 250 MG tablet Take 2 the first day and then one each day after.  ? benzonatate (TESSALON) 200 MG capsule Take 1 capsule (200 mg total) by mouth 2 (two) times daily as needed for cough.  ? predniSONE (DELTASONE) 20 MG tablet 2 po at same time daily for 5 days  ? albuterol (VENTOLIN HFA) 108 (90 Base) MCG/ACT inhaler Inhale 2 puffs into the lungs every 6 (six) hours as needed for wheezing or shortness of breath.  ? Camphor-Menthol-Methyl Sal (HM SALONPAS PAIN RELIEF) 1.2-5.7-6.3 % PTCH Apply 1 patch topically daily as needed.  ? Diclofenac Sodium (PENNSAID) 2 % SOLN Apply topically daily as needed.  ? dicyclomine (BENTYL) 20 MG tablet TAKE 1 TABLET  (20 MG TOTAL) BY MOUTH EVERY 6 (SIX) HOURS. (Patient taking differently: Take 20 mg by mouth 2 (two) times daily.)  ? fluticasone (FLONASE) 50 MCG/ACT nasal spray PLACE 1 SPRAY INTO BOTH NOSTRILS 2 (TWO) TIMES DAILY AS NEEDED FOR ALLERGIES OR RHINITIS.  ? Ibuprofen-Famotidine (DUEXIS) 800-26.6 MG TABS Take by mouth as needed.  ? lidocaine (LIDODERM) 5 % 1 patch as needed.  ? montelukast (SINGULAIR) 10 MG tablet TAKE 1 TABLET BY MOUTH EVERYDAY AT BEDTIME  ? omeprazole (PRILOSEC) 40 MG capsule Take 1 capsule (40 mg total) by mouth 2 (two) times daily before a meal.  ? ?No facility-administered encounter medications on file as of 01/10/2022.  ? ? ?Review of Systems  ?Constitutional:  Negative for chills and fever.  ?HENT:  Positive for congestion and sinus pressure. Negative for ear discharge, ear pain, postnasal drip, rhinorrhea, sneezing, sore throat and voice change.   ?Eyes:  Negative for visual disturbance.  ?Respiratory:  Positive for cough. Negative for shortness of breath and wheezing.   ?Cardiovascular:  Negative for chest pain and leg swelling.  ?Musculoskeletal:  Negative for gait problem.  ?Skin:  Negative for rash.  ?All other systems reviewed and are negative. ? ?Observations/Objective: ?Patient sounds comfortable and in no acute distress ? ?Assessment and Plan: ?Problem List Items Addressed This Visit   ?None ?Visit Diagnoses   ? ? Persistent cough    -  Primary  ? Relevant Medications  ? azithromycin (ZITHROMAX) 250  MG tablet  ? predniSONE (DELTASONE) 20 MG tablet  ? benzonatate (TESSALON) 200 MG capsule  ? ?  ?  ?Persistent cough could be related to light case of asthma so recommended to try an albuterol when he is having his cough but will send some prednisone and azithromycin and Tessalon Perles. ?Follow up plan: ?Return if symptoms worsen or fail to improve. ? ? ?  ?I discussed the assessment and treatment plan with the patient. The patient was provided an opportunity to ask questions and all were  answered. The patient agreed with the plan and demonstrated an understanding of the instructions. ?  ?The patient was advised to call back or seek an in-person evaluation if the symptoms worsen or if the condition fails to improve as anticipated. ? ?The above assessment and management plan was discussed with the patient. The patient verbalized understanding of and has agreed to the management plan. Patient is aware to call the clinic if symptoms persist or worsen. Patient is aware when to return to the clinic for a follow-up visit. Patient educated on when it is appropriate to go to the emergency department.  ? ? ?I provided 7 minutes of non-face-to-face time during this encounter. ? ? ? ?Nils Pyle, MD ?  ? ?

## 2022-01-17 DIAGNOSIS — N5089 Other specified disorders of the male genital organs: Secondary | ICD-10-CM | POA: Diagnosis not present

## 2022-02-11 ENCOUNTER — Other Ambulatory Visit: Payer: Self-pay | Admitting: Family Medicine

## 2022-02-11 DIAGNOSIS — Z9889 Other specified postprocedural states: Secondary | ICD-10-CM

## 2022-02-15 ENCOUNTER — Ambulatory Visit: Payer: Federal, State, Local not specified - PPO | Admitting: Family Medicine

## 2022-02-15 ENCOUNTER — Encounter: Payer: Self-pay | Admitting: Family Medicine

## 2022-02-15 VITALS — BP 130/78 | HR 74 | Temp 97.6°F | Ht 70.0 in | Wt 184.8 lb

## 2022-02-15 DIAGNOSIS — J301 Allergic rhinitis due to pollen: Secondary | ICD-10-CM

## 2022-02-15 DIAGNOSIS — R110 Nausea: Secondary | ICD-10-CM | POA: Diagnosis not present

## 2022-02-15 DIAGNOSIS — R030 Elevated blood-pressure reading, without diagnosis of hypertension: Secondary | ICD-10-CM | POA: Diagnosis not present

## 2022-02-15 MED ORDER — ONDANSETRON HCL 4 MG PO TABS: 4.0000 mg | ORAL_TABLET | Freq: Three times a day (TID) | ORAL | 2 refills | Status: AC | PRN

## 2022-02-15 MED ORDER — FEXOFENADINE-PSEUDOEPHED ER 180-240 MG PO TB24: 1.0000 | ORAL_TABLET | Freq: Every evening | ORAL | 11 refills | Status: DC

## 2022-02-15 NOTE — Patient Instructions (Signed)
Blood pressure cut off is 140/90 before medication. Once medicated, the goal is to keep it under 130/80.

## 2022-02-15 NOTE — Progress Notes (Signed)
Subjective:  Patient ID: George Garcia, male    DOB: [birth year]  Age: 61 y.o. MRN: [id number]  CC: Hypertension   HPI George Garcia presents for  follow-up of hypertension. Patient has no history of headache chest pain or shortness of breath or recent cough. Patient also denies symptoms of TIA such as focal numbness or weakness. 150/100, 142/100 & 132/94 this AM on home this AM. Taken at rest. Sleep a little less than normal - ordered a new mattress that hsn't come in yet. Retired. DOD    Patient has allergic rhinitis symptoms including sneezing frequently sniffling, clear rhinorrhea, watery and itchy eyes. There has been no fever no chills no sweats.  There is some nasal congestion.   History George Garcia has a past medical history of Allergy, Asthma, GERD (gastroesophageal reflux disease), IBS (irritable bowel syndrome), [facility] spotted fever, and Wears glasses.   George Garcia has a past surgical history that includes Shoulder surgery (Right, 2007); colonscopy (2019); and Hemorrhoid surgery (N/A, 09/01/2021).   His family history includes Diabetes in his mother; Fibroids in his daughter; Healthy in his daughter, daughter, and son; Kidney disease in his brother and sister; Stroke in his father and mother.George Garcia reports that George Garcia has never smoked. George Garcia has never used smokeless tobacco. George Garcia reports that George Garcia does not drink alcohol and does not use drugs.  Current Outpatient Medications on File Prior to Visit  Medication Sig Dispense Refill   albuterol (VENTOLIN HFA) 108 (90 Base) MCG/ACT inhaler Inhale 2 puffs into the lungs every 6 (six) hours as needed for wheezing or shortness of breath. 18 g 3   Camphor-Menthol-Methyl Sal (HM SALONPAS PAIN RELIEF) 1.2-5.7-6.3 % PTCH Apply 1 patch topically daily as needed. 40 patch 11   Diclofenac Sodium (PENNSAID) 2 % SOLN Apply topically daily as needed.     dicyclomine (BENTYL) 20 MG tablet TAKE 1 TABLET (20 MG TOTAL) BY MOUTH EVERY 6 (SIX) HOURS. (Patient taking differently:  Take 20 mg by mouth 2 (two) times daily.) 90 tablet 3   fluticasone (FLONASE) 50 MCG/ACT nasal spray PLACE 1 SPRAY INTO BOTH NOSTRILS 2 (TWO) TIMES DAILY AS NEEDED FOR ALLERGIES OR RHINITIS. 48 mL 1   Ibuprofen-Famotidine (DUEXIS) 800-26.6 MG TABS Take by mouth as needed.     lidocaine (LIDODERM) 5 % 1 patch as needed.     montelukast (SINGULAIR) 10 MG tablet TAKE 1 TABLET BY MOUTH EVERYDAY AT BEDTIME 90 tablet 0   omeprazole (PRILOSEC) 40 MG capsule Take 1 capsule (40 mg total) by mouth 2 (two) times daily before a meal. 180 capsule 3   No current facility-administered medications on file prior to visit.    ROS Review of Systems  Constitutional:  Negative for fever.  Respiratory:  Negative for shortness of breath.   Cardiovascular:  Negative for chest pain.  Gastrointestinal:  Positive for nausea (q AM). Negative for abdominal pain, diarrhea and vomiting.  Musculoskeletal:  Negative for arthralgias.  Skin:  Negative for rash.   Objective:  BP 130/78   Pulse 74   Temp 97.6 F (36.4 C)   Ht 5\' 10"  (1.778 m)   Wt 184 lb 12.8 oz (83.8 kg)   SpO2 98%   BMI 26.52 kg/m   BP Readings from Last 3 Encounters:  02/15/22 130/78  11/17/21 124/76  10/13/21 130/82    Wt Readings from Last 3 Encounters:  02/15/22 184 lb 12.8 oz (83.8 kg)  11/17/21 186 lb (84.4 kg)  10/13/21 186 lb (84.4 kg)  Physical Exam Vitals reviewed.  Constitutional:      Appearance: George Garcia is well-developed.  HENT:     Head: Normocephalic and atraumatic.     Right Ear: External ear normal.     Left Ear: External ear normal.     Mouth/Throat:     Pharynx: No oropharyngeal exudate or posterior oropharyngeal erythema.  Eyes:     Pupils: Pupils are equal, round, and reactive to light.  Cardiovascular:     Rate and Rhythm: Normal rate and regular rhythm.     Heart sounds: No murmur heard. Pulmonary:     Effort: No respiratory distress.     Breath sounds: Normal breath sounds.  Musculoskeletal:      Cervical back: Normal range of motion and neck supple.  Neurological:     Mental Status: George Garcia is alert and oriented to person, place, and time.      Assessment & Plan:   George Garcia was seen today for hypertension.  Diagnoses and all orders for this visit:  Elevated blood pressure reading  Nausea  Seasonal allergic rhinitis due to pollen  Other orders -     ondansetron (ZOFRAN) 4 MG tablet; Take 1 tablet (4 mg total) by mouth every 8 (eight) hours as needed for nausea or vomiting. -     fexofenadine-pseudoephedrine (ALLEGRA-D 24) 180-240 MG 24 hr tablet; Take 1 tablet by mouth every evening. For allergy and congestion   Allergies as of 02/15/2022   No Known Allergies      Medication List        Accurate as of Feb 15, 2022  2:28 PM. If you have any questions, ask your nurse or doctor.          STOP taking these medications    azithromycin 250 MG tablet Commonly known as: ZITHROMAX Stopped by: Mechele Claude, MD   benzonatate 200 MG capsule Commonly known as: TESSALON Stopped by: Mechele Claude, MD   predniSONE 20 MG tablet Commonly known as: DELTASONE Stopped by: Mechele Claude, MD       TAKE these medications    albuterol 108 (90 Base) MCG/ACT inhaler Commonly known as: VENTOLIN HFA Inhale 2 puffs into the lungs every 6 (six) hours as needed for wheezing or shortness of breath.   dicyclomine 20 MG tablet Commonly known as: BENTYL TAKE 1 TABLET (20 MG TOTAL) BY MOUTH EVERY 6 (SIX) HOURS. What changed: when to take this   Duexis 800-26.6 MG Tabs Generic drug: Ibuprofen-Famotidine Take by mouth as needed.   fexofenadine-pseudoephedrine 180-240 MG 24 hr tablet Commonly known as: ALLEGRA-D 24 Take 1 tablet by mouth every evening. For allergy and congestion Started by: Mechele Claude, MD   fluticasone 50 MCG/ACT nasal spray Commonly known as: FLONASE PLACE 1 SPRAY INTO BOTH NOSTRILS 2 (TWO) TIMES DAILY AS NEEDED FOR ALLERGIES OR RHINITIS.   HM Salonpas  Pain Relief 1.2-5.7-6.3 % Ptch Generic drug: Camphor-Menthol-Methyl Sal Apply 1 patch topically daily as needed.   lidocaine 5 % Commonly known as: LIDODERM 1 patch as needed.   montelukast 10 MG tablet Commonly known as: SINGULAIR TAKE 1 TABLET BY MOUTH EVERYDAY AT BEDTIME   omeprazole 40 MG capsule Commonly known as: PRILOSEC Take 1 capsule (40 mg total) by mouth 2 (two) times daily before a meal.   ondansetron 4 MG tablet Commonly known as: ZOFRAN Take 1 tablet (4 mg total) by mouth every 8 (eight) hours as needed for nausea or vomiting. Started by: Mechele Claude, MD   Pennsaid 2 %  Soln Generic drug: Diclofenac Sodium Apply topically daily as needed.        Meds ordered this encounter  Medications   ondansetron (ZOFRAN) 4 MG tablet    Sig: Take 1 tablet (4 mg total) by mouth every 8 (eight) hours as needed for nausea or vomiting.    Dispense:  30 tablet    Refill:  2   fexofenadine-pseudoephedrine (ALLEGRA-D 24) 180-240 MG 24 hr tablet    Sig: Take 1 tablet by mouth every evening. For allergy and congestion    Dispense:  30 tablet    Refill:  11    DASH handout given, explained.  Follow-up: Return in about 6 weeks (around 03/29/2022) for blood pressure - with Dr. Louanne Skyeettinger.  Mechele ClaudeWarren Court Gracia, M.D.

## 2022-02-16 ENCOUNTER — Emergency Department (HOSPITAL_COMMUNITY): Payer: Federal, State, Local not specified - PPO

## 2022-02-16 ENCOUNTER — Encounter (HOSPITAL_COMMUNITY): Payer: Self-pay | Admitting: *Deleted

## 2022-02-16 ENCOUNTER — Other Ambulatory Visit: Payer: Self-pay

## 2022-02-16 ENCOUNTER — Emergency Department (HOSPITAL_COMMUNITY)
Admission: EM | Admit: 2022-02-16 | Discharge: 2022-02-16 | Disposition: A | Discharge status: Home / Self Care | Payer: Federal, State, Local not specified - PPO | Attending: Emergency Medicine | Admitting: Emergency Medicine

## 2022-02-16 DIAGNOSIS — Z7951 Long term (current) use of inhaled steroids: Secondary | ICD-10-CM | POA: Diagnosis not present

## 2022-02-16 DIAGNOSIS — D72819 Decreased white blood cell count, unspecified: Secondary | ICD-10-CM | POA: Insufficient documentation

## 2022-02-16 DIAGNOSIS — I10 Essential (primary) hypertension: Secondary | ICD-10-CM | POA: Insufficient documentation

## 2022-02-16 DIAGNOSIS — E119 Type 2 diabetes mellitus without complications: Secondary | ICD-10-CM | POA: Diagnosis not present

## 2022-02-16 DIAGNOSIS — R079 Chest pain, unspecified: Secondary | ICD-10-CM | POA: Diagnosis not present

## 2022-02-16 DIAGNOSIS — R0789 Other chest pain: Secondary | ICD-10-CM | POA: Diagnosis not present

## 2022-02-16 DIAGNOSIS — J45909 Unspecified asthma, uncomplicated: Secondary | ICD-10-CM | POA: Diagnosis not present

## 2022-02-16 LAB — CBC
HCT: 45.5 % (ref 39.0–52.0)
Hemoglobin: 15 g/dL (ref 13.0–17.0)
MCH: 29.1 pg (ref 26.0–34.0)
MCHC: 33 g/dL (ref 30.0–36.0)
MCV: 88.3 fL (ref 80.0–100.0)
Platelets: 240 10*3/uL (ref 150–400)
RBC: 5.15 MIL/uL (ref 4.22–5.81)
RDW: 13.1 % (ref 11.5–15.5)
WBC: 3.6 10*3/uL — ABNORMAL LOW (ref 4.0–10.5)
nRBC: 0 % (ref 0.0–0.2)

## 2022-02-16 LAB — BASIC METABOLIC PANEL
Anion gap: 3 — ABNORMAL LOW (ref 5–15)
BUN: 11 mg/dL (ref 6–20)
CO2: 29 mmol/L (ref 22–32)
Calcium: 9.2 mg/dL (ref 8.9–10.3)
Chloride: 107 mmol/L (ref 98–111)
Creatinine, Ser: 1.1 mg/dL (ref 0.61–1.24)
GFR, Estimated: >60 mL/min (ref >60)
Glucose, Bld: 111 mg/dL — ABNORMAL HIGH (ref 70–99)
Potassium: 4 mmol/L (ref 3.5–5.1)
Sodium: 139 mmol/L (ref 135–145)

## 2022-02-16 LAB — TROPONIN I (HIGH SENSITIVITY)
Troponin I (High Sensitivity): 3 ng/L (ref <18)
Troponin I (High Sensitivity): 3 ng/L (ref <18)

## 2022-02-16 NOTE — Discharge Instructions (Addendum)
Lab work imaging all reassuring.  Use over-the-counter pain medication as needed.  I referred you to the [facility] cardiologist, you may also follow-up with your primary care provider.  Come back to the emergency department if you develop chest pain, shortness of breath, severe abdominal pain, uncontrolled nausea, vomiting, diarrhea.

## 2022-02-16 NOTE — ED Provider Notes (Signed)
[facility] EMERGENCY DEPARTMENT Provider Note   CSN: [id number] Arrival date & time: 02/16/22  1545     History  Chief Complaint  Patient presents with   Chest Pain    George Garcia is a 61 y.o. male.  HPI  With medical history including GERD asthma presents emerged part with complaints of left-sided chest pain.  Patient's chest pain started today, started while he was on his riding mower, had a pressure-like sensation on his left side which then radiate to his left arm, he denies becoming lightheaded dizziness nausea or vomiting did not become diaphoretic no shortness of breath, states that he took 2 aspirins and his chest pain has resolved.  He states he has slight pain when he takes a deep inspiration but denies any pleuritic chain he has no cardiac history no history PEs or DVTs currently not on hormone therapy no recent surgeries or long car rides, he is not being treated for hypertension, diabetes, hyperlipidemia does not smoke denies illicit drug use no close family history of cardiac abnormalities.  Patient is he exercises and does not have chest pain.  Home Medications Prior to Admission medications   Medication Sig Start Date End Date Taking? Authorizing Provider  albuterol (VENTOLIN HFA) 108 (90 Base) MCG/ACT inhaler Inhale 2 puffs into the lungs every 6 (six) hours as needed for wheezing or shortness of breath. 10/06/19   Dettinger, Elige Radon, MD  Camphor-Menthol-Methyl Sal (HM SALONPAS PAIN RELIEF) 1.2-5.7-6.3 % PTCH Apply 1 patch topically daily as needed. 01/06/20   Dettinger, Elige Radon, MD  Diclofenac Sodium (PENNSAID) 2 % SOLN Apply topically daily as needed.    [provider]  dicyclomine (BENTYL) 20 MG tablet TAKE 1 TABLET (20 MG TOTAL) BY MOUTH EVERY 6 (SIX) HOURS. Patient taking differently: Take 20 mg by mouth 2 (two) times daily. 09/23/19   Dettinger, Elige Radon, MD  fexofenadine-pseudoephedrine (ALLEGRA-D 24) 180-240 MG 24 hr tablet Take 1 tablet by mouth every  evening. For allergy and congestion 02/15/22   Mechele Claude, MD  fluticasone (FLONASE) 50 MCG/ACT nasal spray PLACE 1 SPRAY INTO BOTH NOSTRILS 2 (TWO) TIMES DAILY AS NEEDED FOR ALLERGIES OR RHINITIS. 11/14/19   Dettinger, Elige Radon, MD  Ibuprofen-Famotidine (DUEXIS) 800-26.6 MG TABS Take by mouth as needed.    [provider]  lidocaine (LIDODERM) 5 % 1 patch as needed. 04/14/20   [provider]  montelukast (SINGULAIR) 10 MG tablet TAKE 1 TABLET BY MOUTH EVERYDAY AT BEDTIME 09/17/20   Dettinger, Elige Radon, MD  omeprazole (PRILOSEC) 40 MG capsule Take 1 capsule (40 mg total) by mouth 2 (two) times daily before a meal. 12/05/18   Dettinger, Elige Radon, MD  ondansetron (ZOFRAN) 4 MG tablet Take 1 tablet (4 mg total) by mouth every 8 (eight) hours as needed for nausea or vomiting. 02/15/22   Mechele Claude, MD      Allergies    Patient has no known allergies.    Review of Systems   Review of Systems  Constitutional:  Negative for chills and fever.  Respiratory:  Negative for shortness of breath.   Cardiovascular:  Positive for chest pain.  Gastrointestinal:  Negative for abdominal pain.  Neurological:  Negative for headaches.   Physical Exam Updated Vital Signs BP 130/89 (BP Location: Right Arm)   Pulse 65   Temp 97.9 F (36.6 C) (Oral)   Resp 14   Ht 5\' 10"  (1.778 m)   Wt 83 kg   SpO2 100%  BMI 26.26 kg/m  Physical Exam Vitals and nursing note reviewed.  Constitutional:      General: He is not in acute distress.    Appearance: He is not ill-appearing.  HENT:     Head: Normocephalic and atraumatic.     Nose: No congestion.  Eyes:     Conjunctiva/sclera: Conjunctivae normal.  Cardiovascular:     Rate and Rhythm: Normal rate and regular rhythm.     Pulses: Normal pulses.     Heart sounds: No murmur heard.   No friction rub. No gallop.  Pulmonary:     Effort: No respiratory distress.     Breath sounds: No wheezing, rhonchi or rales.     Comments: There is no  chest tenderness on my exam. Abdominal:     Palpations: Abdomen is soft.     Tenderness: There is no abdominal tenderness. There is no right CVA tenderness or left CVA tenderness.  Musculoskeletal:     Right lower leg: No edema.     Left lower leg: No edema.  Skin:    General: Skin is warm and dry.  Neurological:     Mental Status: He is alert.  Psychiatric:        Mood and Affect: Mood normal.    ED Results / Procedures / Treatments   Labs (all labs ordered are listed, but only abnormal results are displayed) Labs Reviewed  BASIC METABOLIC PANEL - Abnormal; Notable for the following components:      Result Value   Glucose, Bld 111 (*)    Anion gap 3 (*)    All other components within normal limits  CBC - Abnormal; Notable for the following components:   WBC 3.6 (*)    All other components within normal limits  TROPONIN I (HIGH SENSITIVITY)  TROPONIN I (HIGH SENSITIVITY)    EKG EKG Interpretation  Date/Time:  Wednesday Feb 16 2022 15:52:56 EDT Ventricular Rate:  72 PR Interval:  150 QRS Duration: 72 QT Interval:  382 QTC Calculation: 418 R Axis:   49 Text Interpretation: Normal sinus rhythm Right atrial enlargement Borderline ECG No previous ECGs available Confirmed by Linwood Dibbles [phone]) on 02/16/2022 3:58:06 PM  Radiology DG Chest 2 View  Result Date: 02/16/2022 CLINICAL DATA:  Left-sided chest pain beginning today EXAM: CHEST - 2 VIEW COMPARISON:  None FINDINGS: Heart and mediastinal shadows are normal. The lungs are clear. The vascularity is normal. No effusions. Mild scoliotic curvature of the spine. IMPRESSION: No active cardiopulmonary disease. Electronically Signed   By: Paulina Fusi M.D.   On: 02/16/2022 16:11    Procedures Procedures    Medications Ordered in ED Medications - No data to display  ED Course/ Medical Decision Making/ A&P                           Medical Decision Making Amount and/or Complexity of Data Reviewed Labs:  ordered. Radiology: ordered.   This patient presents to the ED for concern of chest pain, this involves an extensive number of treatment options, and is a complaint that carries with it a high risk of complications and morbidity.  The differential diagnosis includes ACS, PE, pneumonia    Additional history obtained:  Additional history obtained from wife at bedside External records from outside source obtained and reviewed including previous PCP notes   Co morbidities that complicate the patient evaluation  N/A  Social Determinants of Health:  N/A    Lab  Tests:  I Ordered, and personally interpreted labs.  The pertinent results include: CBC shows leukocytopenia with a white count of 3.6, BMP shows glucose of 111, first troponin is 3,   Imaging Studies ordered:  I ordered imaging studies including chest x-ray I independently visualized and interpreted imaging which showed unremarkable I agree with the radiologist interpretation   Cardiac Monitoring:  The patient was maintained on a cardiac monitor.  I personally viewed and interpreted the cardiac monitored which showed an underlying rhythm of: EKG without signs of ischemia   Medicines ordered and prescription drug management:  I ordered medication including N/A I have reviewed the patients home medicines and have made adjustments as needed  Critical Interventions:  N/A   Reevaluation:  Chest pain that has since resolved triage obtained lab work which I personally reviewed and they are unremarkable at this time.  Will await second troponin continue to monitor.    Consultations Obtained:  N/A   Test Considered:  CTA of chest-suspicion for PE is very low at this time presentation atypical no new ox requirements he has low risk factors.    Rule out I have low suspicion for ACS as history is atypical, patient has no cardiac history, EKG was sinus rhythm without signs of ischemia, patient for troponin is  negative, second troponin is negative. he has a heart score of 2.  Low suspicion for PE as patient denies pleuritic chest pain, shortness of breath, patient denies leg pain, no pedal edema noted on exam, vital signs reassuring nontachypneic nonhypoxic no new ox requirements. low suspicion for AAA or aortic dissection as history is atypical, patient has low risk factors.  Low suspicion for systemic infection as patient is nontoxic-appearing, vital signs reassuring, no obvious source infection noted on exam.    Dispostion and problem list  After consideration of the diagnostic results and the patients response to treatment, I feel that the patent would benefit from discharge.   Chest pain-since resolved, unclear etiology I suspect muscular in nature, due to his age and has not seen a cardiologist in the past would recommend outpatient follow-up.            Final Clinical Impression(s) / ED Diagnoses Final diagnoses:  Atypical chest pain    Rx / DC Orders ED Discharge Orders          Ordered    Ambulatory referral to Cardiology       Comments: If you have not heard from the Cardiology office within the next 72 hours please call [phone].   02/16/22 1922              Carroll SageFaulkner, Josafat Enrico J, PA-C 02/16/22 Mylinda [id number]    Knapp, Jon, MD 02/17/22 [phone]

## 2022-02-16 NOTE — ED Provider Triage Note (Signed)
Emergency Medicine Provider Triage Evaluation Note  George Garcia , a 61 y.o. male  was evaluated in triage.  Pt complains of chest pain.  Patient states he noticed the symptoms today while at mowing the lawn and exerting himself.  Started developing left-sided chest pressure rating to his shoulder and arm.  Symptoms have been decreasing.  He did take aspirin at home.  No history of heart disease..  Review of Systems  Positive: Chest pain Negative: Fever  Physical Exam  BP (!) 146/98 (BP Location: Right Arm)   Pulse 66   Temp 97.9 F (36.6 C) (Oral)   Resp 18   Ht 1.778 m (5\' 10" )   Wt 83 kg   SpO2 100%   BMI 26.26 kg/m  Gen:   Awake, no distress   Resp:  Normal effort  MSK:   Moves extremities without difficulty  Other:    Medical Decision Making  Medically screening exam initiated at 4:22 PM.  Appropriate orders placed.  George Garcia was informed that the remainder of the evaluation will be completed by another provider, this initial triage assessment does not replace that evaluation, and the importance of remaining in the ED until their evaluation is complete.   EKG Interpretation  Date/Time:  Wednesday Feb 16 2022 15:52:56 EDT Ventricular Rate:  72 PR Interval:  150 QRS Duration: 72 QT Interval:  382 QTC Calculation: 418 R Axis:   49 Text Interpretation: Normal sinus rhythm Right atrial enlargement Borderline ECG No previous ECGs available Confirmed by 03-[birth year]-1981 [phone]) on 02/16/2022 3:58:06 PM        Symptoms concerning for the possibility of acute coronary syndrome.  No signs of acute ST elevation MI on EKG.  We will proceed with cardiac evaluation   02/18/2022, MD 02/16/22 1625

## 2022-02-16 NOTE — ED Notes (Signed)
Patient resting in bed, alert and oriented x 4, family at bedside , no c/o pain or distress. Respiration even and non-labored. Patient states that when he takes a deep breath he experiences chest discomfort.

## 2022-02-16 NOTE — ED Triage Notes (Signed)
Pt c/o left side chest pain that started today while ride mowing the yard; pt describes the pain as a pressure, tightness and states the pain radiates down his arm; pt c/o feeling weak when the pain started

## 2022-03-30 ENCOUNTER — Ambulatory Visit: Payer: Federal, State, Local not specified - PPO | Admitting: Nurse Practitioner

## 2022-03-30 ENCOUNTER — Encounter: Payer: Self-pay | Admitting: Nurse Practitioner

## 2022-03-30 VITALS — BP 142/98 | HR 61 | Temp 98.6°F | Ht 70.0 in | Wt 185.0 lb

## 2022-03-30 DIAGNOSIS — G8929 Other chronic pain: Secondary | ICD-10-CM | POA: Diagnosis not present

## 2022-03-30 DIAGNOSIS — M5441 Lumbago with sciatica, right side: Secondary | ICD-10-CM

## 2022-03-30 MED ORDER — METHOCARBAMOL 500 MG PO TABS: 500.0000 mg | ORAL_TABLET | Freq: Four times a day (QID) | ORAL | 1 refills | Status: AC

## 2022-03-30 MED ORDER — PREDNISONE 20 MG PO TABS: 20.0000 mg | ORAL_TABLET | Freq: Every day | ORAL | 0 refills | Status: DC

## 2022-03-30 NOTE — Assessment & Plan Note (Signed)
Chronic back pain symptoms not well resolved.  Patient has a flareup and understands the implication of long-term back pain.  80 Depo-Medrol shot given in clinic.  20 mg prednisone tablet by mouth daily for 6 days.  Robaxin 500 mg tablet by mouth 4 times daily as needed for musculoskeletal pain.  Education provided to patient printed handouts given.  Patient knows to follow-up with worsening unresolved symptoms.

## 2022-03-30 NOTE — Patient Instructions (Signed)
Chronic Back Pain When back pain lasts longer than 3 months, it is called chronic back pain. Pain may get worse at certain times (flare-ups). There are things you can do at home to manage your pain. Follow these instructions at home: Pay attention to any changes in your symptoms. Take these actions to help with your pain: Managing pain and stiffness     If told, put ice on the painful area. Your doctor may tell you to use ice for 24-48 hours after the flare-up starts. To do this: Put ice in a plastic bag. Place a towel between your skin and the bag. Leave the ice on for 20 minutes, 2-3 times a day. If told, put heat on the painful area. Do this as often as told by your doctor. Use the heat source that your doctor recommends, such as a moist heat pack or a heating pad. Place a towel between your skin and the heat source. Leave the heat on for 20-30 minutes. Take off the heat if your skin turns bright red. This is especially important if you are unable to feel pain, heat, or cold. You may have a greater risk of getting burned. Soak in a warm bath. This can help relieve pain. Activity  Avoid bending and other activities that make pain worse. When standing: Keep your upper back and neck straight. Keep your shoulders pulled back. Avoid slouching. When sitting: Keep your back straight. Relax your shoulders. Do not round your shoulders or pull them backward. Do not sit or stand in one place for long periods of time. Take short rest breaks during the day. Lying down or standing is usually better than sitting. Resting can help relieve pain. When sitting or lying down for a long time, do some mild activity or stretching. This will help to prevent stiffness and pain. Get regular exercise. Ask your doctor what activities are safe for you. Do not lift anything that is heavier than 10 lb (4.5 kg) or the limit that you are told, until your doctor says that it is safe. To prevent injury when you lift  things: Bend your knees. Keep the weight close to your body. Avoid twisting. Sleep on a firm mattress. Try lying on your side with your knees slightly bent. If you lie on your back, put a pillow under your knees. Medicines Treatment may include medicines for pain and swelling taken by mouth or put on the skin, prescription pain medicine, or muscle relaxants. Take over-the-counter and prescription medicines only as told by your doctor. Ask your doctor if the medicine prescribed to you: Requires you to avoid driving or using machinery. Can cause trouble pooping (constipation). You may need to take these actions to prevent or treat trouble pooping: Drink enough fluid to keep your pee (urine) pale yellow. Take over-the-counter or prescription medicines. Eat foods that are high in fiber. These include beans, whole grains, and fresh fruits and vegetables. Limit foods that are high in fat and sugars. These include fried or sweet foods. General instructions Do not use any products that contain nicotine or tobacco, such as cigarettes, e-cigarettes, and chewing tobacco. If you need help quitting, ask your doctor. Keep all follow-up visits as told by your doctor. This is important. Contact a doctor if: Your pain does not get better with rest or medicine. Your pain gets worse, or you have new pain. You have a high fever. You lose weight very quickly. You have trouble doing your normal activities. Get help right away   if: One or both of your legs or feet feel weak. One or both of your legs or feet lose feeling (have numbness). You have trouble controlling when you poop (have a bowel movement) or pee (urinate). You have bad back pain and: You feel like you may vomit (nauseous), or you vomit. You have pain in your belly (abdomen). You have shortness of breath. You faint. Summary When back pain lasts longer than 3 months, it is called chronic back pain. Pain may get worse at certain times  (flare-ups). Use ice and heat as told by your doctor. Your doctor may tell you to use ice after flare-ups. This information is not intended to replace advice given to you by your health care provider. Make sure you discuss any questions you have with your health care provider. Document Revised: 10/16/2019 Document Reviewed: 10/16/2019 Elsevier Patient Education  2023 [employer]  

## 2022-03-30 NOTE — Progress Notes (Signed)
Acute Office Visit  Subjective:     Patient ID: George Garcia, male    DOB: 07-Dec-1960, 61 y.o.   MRN: [id number]  Chief Complaint  Patient presents with   Back Pain    Off and on for years now     Back Pain This is a chronic problem. The current episode started more than 1 year ago. The problem occurs intermittently. The problem is unchanged. The pain is present in the lumbar spine. The quality of the pain is described as aching. The pain radiates to the right thigh. The pain is moderate. The pain is The same all the time. The symptoms are aggravated by position and bending. Stiffness is present All day. Pertinent negatives include no abdominal pain, bladder incontinence, chest pain, fever, numbness, paresis, tingling or weight loss. He has tried muscle relaxant and heat for the symptoms. The treatment provided significant relief.     Review of Systems  Constitutional: Negative.  Negative for chills, fever, malaise/fatigue and weight loss.  HENT: Negative.    Cardiovascular:  Negative for chest pain.  Gastrointestinal: Negative.  Negative for abdominal pain.  Genitourinary:  Negative for bladder incontinence.  Musculoskeletal:  Positive for back pain.  Skin: Negative.  Negative for itching and rash.  Neurological:  Negative for tingling and numbness.  All other systems reviewed and are negative.       Objective:    BP (!) 142/98   Pulse 61   Temp 98.6 F (37 C)   Ht 5\' 10"  (1.778 m)   Wt 185 lb (83.9 kg)   SpO2 100%   BMI 26.54 kg/m    Physical Exam Vitals and nursing note reviewed.  Constitutional:      Appearance: Normal appearance.  HENT:     Head: Normocephalic.     Right Ear: External ear normal.     Left Ear: External ear normal.     Nose: Nose normal.  Cardiovascular:     Rate and Rhythm: Normal rate and regular rhythm.     Pulses: Normal pulses.     Heart sounds: Normal heart sounds.  Pulmonary:     Effort: Pulmonary effort is normal.     Breath  sounds: Normal breath sounds.  Abdominal:     General: Bowel sounds are normal.  Musculoskeletal:     Cervical back: Normal range of motion.     Lumbar back: Tenderness present. Decreased range of motion.  Neurological:     General: No focal deficit present.     Mental Status: He is alert and oriented to person, place, and time.  Psychiatric:        Mood and Affect: Mood normal.        Behavior: Behavior normal.     No results found for any visits on 03/30/22.      Assessment & Plan:   Problem List Items Addressed This Visit       Nervous and Auditory   Chronic midline low back pain with right-sided sciatica - Primary    Chronic back pain symptoms not well resolved.  Patient has a flareup and understands the implication of long-term back pain.  80 Depo-Medrol shot given in clinic.  20 mg prednisone tablet by mouth daily for 6 days.  Robaxin 500 mg tablet by mouth 4 times daily as needed for musculoskeletal pain.  Education provided to patient printed handouts given.  Patient knows to follow-up with worsening unresolved symptoms.      Relevant Medications  methocarbamol (ROBAXIN) 500 MG tablet   predniSONE (DELTASONE) 20 MG tablet    Meds ordered this encounter  Medications   methocarbamol (ROBAXIN) 500 MG tablet    Sig: Take 1 tablet (500 mg total) by mouth 4 (four) times daily.    Dispense:  30 tablet    Refill:  1    Order Specific Question:   Supervising Provider    Answer:   Standley Brooking   predniSONE (DELTASONE) 20 MG tablet    Sig: Take 1 tablet (20 mg total) by mouth daily with breakfast.    Dispense:  6 tablet    Refill:  0    Order Specific Question:   Supervising Provider    Answer:   Standley Brooking    No follow-ups on file.  Daryll Drown, NP

## 2022-04-05 NOTE — Progress Notes (Unsigned)
Cardiology Office Note  Date: 04/06/2022   ID: [name], DOB [birth year], MRN [id number]  PCP:  Dettinger, Elige Radon, MD  Cardiologist:  Nona Dell, MD Electrophysiologist:  None   Chief Complaint  Patient presents with   History of chest pain    History of Present Illness: George Garcia is a 61 y.o. male referred for cardiology consultation by Dr. Lynelle Doctor for the evaluation of atypical chest pain.  Patient was seen in the ER at [facility] back in May, I reviewed the note.  ECG showed no acute ST segment changes and high-sensitivity troponin I levels were normal.  He tells me that he had had a sinus infection with chest congestion prior to the onset of his chest discomfort.  He was outside push mowing and weed eating his lawn when he developed a tightness in his chest with some radiation to the left arm.  This spontaneously resolved and his work-up in the ER was overall reassuring as outlined.  He has not had any recurrent symptoms.  States that he is fairly active at baseline.  He still runs up to 2 miles at a time for exercise, works out with [employer].  Generally does not feel exertional limitations.  Reports fairly good control of his asthma, but he does have some flares.  He underwent a treadmill test several years ago, no more recent ischemic work-up.  Past Medical History:  Diagnosis Date   Allergy    Asthma    GERD (gastroesophageal reflux disease)    IBS (irritable bowel syndrome)    [facility] spotted fever    2020 and 2021   Wears glasses     Past Surgical History:  Procedure Laterality Date   colonscopy  2019   HEMORRHOID SURGERY N/A 09/01/2021   Procedure: SINGLE COLUMN HEMORRHOIDECTOMY;  Surgeon: Romie Levee, MD;  Location: [facility];  Service: General;  Laterality: N/A;   SHOULDER SURGERY Right 2007   rotator cuff repair    Current Outpatient Medications  Medication Sig Dispense Refill   albuterol (VENTOLIN HFA) 108 (90 Base)  MCG/ACT inhaler Inhale 2 puffs into the lungs every 6 (six) hours as needed for wheezing or shortness of breath. 18 g 3   Camphor-Menthol-Methyl Sal (HM SALONPAS PAIN RELIEF) 1.2-5.7-6.3 % PTCH Apply 1 patch topically daily as needed. 40 patch 11   Diclofenac Sodium (PENNSAID) 2 % SOLN Apply topically daily as needed.     dicyclomine (BENTYL) 20 MG tablet TAKE 1 TABLET (20 MG TOTAL) BY MOUTH EVERY 6 (SIX) HOURS. (Patient taking differently: Take 20 mg by mouth 2 (two) times daily.) 90 tablet 3   fexofenadine-pseudoephedrine (ALLEGRA-D 24) 180-240 MG 24 hr tablet Take 1 tablet by mouth every evening. For allergy and congestion 30 tablet 11   fluticasone (FLONASE) 50 MCG/ACT nasal spray PLACE 1 SPRAY INTO BOTH NOSTRILS 2 (TWO) TIMES DAILY AS NEEDED FOR ALLERGIES OR RHINITIS. 48 mL 1   Ibuprofen-Famotidine (DUEXIS) 800-26.6 MG TABS Take by mouth as needed.     lidocaine (LIDODERM) 5 % 1 patch as needed.     methocarbamol (ROBAXIN) 500 MG tablet Take 1 tablet (500 mg total) by mouth 4 (four) times daily. 30 tablet 1   montelukast (SINGULAIR) 10 MG tablet TAKE 1 TABLET BY MOUTH EVERYDAY AT BEDTIME 90 tablet 0   omeprazole (PRILOSEC) 40 MG capsule Take 1 capsule (40 mg total) by mouth 2 (two) times daily before a meal. 180 capsule 3   ondansetron (ZOFRAN) 4  MG tablet Take 1 tablet (4 mg total) by mouth every 8 (eight) hours as needed for nausea or vomiting. 30 tablet 2   No current facility-administered medications for this visit.   Allergies:  Patient has no known allergies.   Social History: The patient  reports that he has never smoked. He has never used smokeless tobacco. He reports that he does not drink alcohol and does not use drugs.   Family History: The patient's family history includes Diabetes in his mother; Fibroids in his daughter; Healthy in his daughter, daughter, and son; Kidney disease in his brother and sister; Stroke in his father and mother.   ROS: No palpitations or  syncope.  Physical Exam: VS:  BP 116/80 (BP Location: Left Arm, Patient Position: Sitting, Cuff Size: Large)   Pulse 62   Ht 5\' 10"  (1.778 m)   Wt 186 lb (84.4 kg)   SpO2 99%   BMI 26.69 kg/m , BMI Body mass index is 26.69 kg/m.  Wt Readings from Last 3 Encounters:  04/06/22 186 lb (84.4 kg)  03/30/22 185 lb (83.9 kg)  02/16/22 182 lb 15.7 oz (83 kg)    General: Patient appears comfortable at rest. HEENT: Conjunctiva and lids normal, oropharynx clear. Neck: Supple, no elevated JVP or carotid bruits, no thyromegaly. Lungs: Clear to auscultation, nonlabored breathing at rest. Cardiac: Regular rate and rhythm, no S3 or significant systolic murmur. Abdomen: Soft, nontender, bowel sounds present. Extremities: No pitting edema, distal pulses 2+. Skin: Warm and dry. Musculoskeletal: No kyphosis. Neuropsychiatric: Alert and oriented x3, affect grossly appropriate.  ECG:  An ECG dated 02/16/2022 was personally reviewed today and demonstrated:  Sinus rhythm.  Recent Labwork: 02/16/2022: BUN 11; Creatinine, Ser 1.10; Hemoglobin 15.0; Platelets 240; Potassium 4.0; Sodium 139   Other Studies Reviewed Today:  Chest x-ray 02/16/2022: FINDINGS: Heart and mediastinal shadows are normal. The lungs are clear. The vascularity is normal. No effusions. Mild scoliotic curvature of the spine.   IMPRESSION: No active cardiopulmonary disease.  Assessment and Plan:  History of chest pain with mixed features.  Possible pulmonary etiology based on overall description and baseline asthma, but cannot exclude an episode of angina.  No ACS evident by cardiac enzymes and his ECG was essentially normal on ER evaluation.  He has not undergone any recent ischemic testing.  Relatively active at baseline.  We will plan a screening GXT.  Medication Adjustments/Labs and Tests Ordered: Current medicines are reviewed at length with the patient today.  Concerns regarding medicines are outlined above.   Tests  Ordered: Orders Placed This Encounter  Procedures   Exercise Tolerance Test    Medication Changes: No orders of the defined types were placed in this encounter.   Disposition:  Follow up  test results.  Signed, 02/18/2022, MD, [facility] 04/06/2022 9:18 AM    [facility] at [facility] [address] [place], [place], [place] Phone: [phone]; Fax: [phone]

## 2022-04-06 ENCOUNTER — Ambulatory Visit: Payer: Federal, State, Local not specified - PPO | Admitting: Cardiology

## 2022-04-06 ENCOUNTER — Encounter: Payer: Self-pay | Admitting: Cardiology

## 2022-04-06 VITALS — BP 116/80 | HR 62 | Ht 70.0 in | Wt 186.0 lb

## 2022-04-06 DIAGNOSIS — R0789 Other chest pain: Secondary | ICD-10-CM | POA: Diagnosis not present

## 2022-04-06 NOTE — Patient Instructions (Signed)
Medication Instructions:  Your physician recommends that you continue on your current medications as directed. Please refer to the Current Medication list given to you today.   Labwork: none  Testing/Procedures: Your physician has requested that you have an exercise tolerance test. For further information please visit [email or web address] Please also follow instruction sheet, as given.  Follow-Up:  Your physician recommends that you schedule a follow-up appointment in: Follow Pending Results  Any Other Special Instructions Will Be Listed Below (If Applicable).  If you need a refill on your cardiac medications before your next appointment, please call your pharmacy.

## 2022-04-13 ENCOUNTER — Ambulatory Visit (HOSPITAL_COMMUNITY)
Admission: RE | Admit: 2022-04-13 | Discharge: 2022-04-13 | Disposition: A | Discharge status: Home / Self Care | Payer: Federal, State, Local not specified - PPO | Source: Ambulatory Visit | Attending: Cardiology | Admitting: Cardiology

## 2022-04-13 DIAGNOSIS — R0789 Other chest pain: Secondary | ICD-10-CM | POA: Diagnosis not present

## 2022-04-13 LAB — EXERCISE TOLERANCE TEST
Angina Index: 0
Duke Treadmill Score: 12
Estimated workload: 13.7
Exercise duration (min): 11 min
Exercise duration (sec): 46 s
MPHR: 161 {beats}/min
Peak HR: 153 {beats}/min
Percent HR: 95 %
RPE: 12
Rest HR: 56 {beats}/min
ST Depression (mm): 0 mm

## 2022-04-20 DIAGNOSIS — M9902 Segmental and somatic dysfunction of thoracic region: Secondary | ICD-10-CM | POA: Diagnosis not present

## 2022-04-20 DIAGNOSIS — M9903 Segmental and somatic dysfunction of lumbar region: Secondary | ICD-10-CM | POA: Diagnosis not present

## 2022-04-20 DIAGNOSIS — M62830 Muscle spasm of back: Secondary | ICD-10-CM | POA: Diagnosis not present

## 2022-04-20 DIAGNOSIS — M9901 Segmental and somatic dysfunction of cervical region: Secondary | ICD-10-CM | POA: Diagnosis not present

## 2022-04-21 DIAGNOSIS — M9903 Segmental and somatic dysfunction of lumbar region: Secondary | ICD-10-CM | POA: Diagnosis not present

## 2022-04-21 DIAGNOSIS — M62830 Muscle spasm of back: Secondary | ICD-10-CM | POA: Diagnosis not present

## 2022-04-21 DIAGNOSIS — M9901 Segmental and somatic dysfunction of cervical region: Secondary | ICD-10-CM | POA: Diagnosis not present

## 2022-04-21 DIAGNOSIS — M9902 Segmental and somatic dysfunction of thoracic region: Secondary | ICD-10-CM | POA: Diagnosis not present

## 2022-04-25 DIAGNOSIS — M9903 Segmental and somatic dysfunction of lumbar region: Secondary | ICD-10-CM | POA: Diagnosis not present

## 2022-04-25 DIAGNOSIS — M9901 Segmental and somatic dysfunction of cervical region: Secondary | ICD-10-CM | POA: Diagnosis not present

## 2022-04-25 DIAGNOSIS — M62830 Muscle spasm of back: Secondary | ICD-10-CM | POA: Diagnosis not present

## 2022-04-25 DIAGNOSIS — M9902 Segmental and somatic dysfunction of thoracic region: Secondary | ICD-10-CM | POA: Diagnosis not present

## 2022-04-26 DIAGNOSIS — M9903 Segmental and somatic dysfunction of lumbar region: Secondary | ICD-10-CM | POA: Diagnosis not present

## 2022-04-26 DIAGNOSIS — M9902 Segmental and somatic dysfunction of thoracic region: Secondary | ICD-10-CM | POA: Diagnosis not present

## 2022-04-26 DIAGNOSIS — M9901 Segmental and somatic dysfunction of cervical region: Secondary | ICD-10-CM | POA: Diagnosis not present

## 2022-04-26 DIAGNOSIS — M62830 Muscle spasm of back: Secondary | ICD-10-CM | POA: Diagnosis not present

## 2022-04-27 DIAGNOSIS — M62830 Muscle spasm of back: Secondary | ICD-10-CM | POA: Diagnosis not present

## 2022-04-27 DIAGNOSIS — M9901 Segmental and somatic dysfunction of cervical region: Secondary | ICD-10-CM | POA: Diagnosis not present

## 2022-04-27 DIAGNOSIS — M9903 Segmental and somatic dysfunction of lumbar region: Secondary | ICD-10-CM | POA: Diagnosis not present

## 2022-04-27 DIAGNOSIS — M9902 Segmental and somatic dysfunction of thoracic region: Secondary | ICD-10-CM | POA: Diagnosis not present

## 2022-05-04 DIAGNOSIS — M62830 Muscle spasm of back: Secondary | ICD-10-CM | POA: Diagnosis not present

## 2022-05-04 DIAGNOSIS — M9901 Segmental and somatic dysfunction of cervical region: Secondary | ICD-10-CM | POA: Diagnosis not present

## 2022-05-04 DIAGNOSIS — M9902 Segmental and somatic dysfunction of thoracic region: Secondary | ICD-10-CM | POA: Diagnosis not present

## 2022-05-04 DIAGNOSIS — M9903 Segmental and somatic dysfunction of lumbar region: Secondary | ICD-10-CM | POA: Diagnosis not present

## 2022-05-09 DIAGNOSIS — M9902 Segmental and somatic dysfunction of thoracic region: Secondary | ICD-10-CM | POA: Diagnosis not present

## 2022-05-09 DIAGNOSIS — M9903 Segmental and somatic dysfunction of lumbar region: Secondary | ICD-10-CM | POA: Diagnosis not present

## 2022-05-09 DIAGNOSIS — M62830 Muscle spasm of back: Secondary | ICD-10-CM | POA: Diagnosis not present

## 2022-05-09 DIAGNOSIS — M9901 Segmental and somatic dysfunction of cervical region: Secondary | ICD-10-CM | POA: Diagnosis not present

## 2022-06-01 DIAGNOSIS — M9903 Segmental and somatic dysfunction of lumbar region: Secondary | ICD-10-CM | POA: Diagnosis not present

## 2022-06-01 DIAGNOSIS — M9901 Segmental and somatic dysfunction of cervical region: Secondary | ICD-10-CM | POA: Diagnosis not present

## 2022-06-01 DIAGNOSIS — M9902 Segmental and somatic dysfunction of thoracic region: Secondary | ICD-10-CM | POA: Diagnosis not present

## 2022-06-01 DIAGNOSIS — M62830 Muscle spasm of back: Secondary | ICD-10-CM | POA: Diagnosis not present

## 2022-06-08 DIAGNOSIS — M9902 Segmental and somatic dysfunction of thoracic region: Secondary | ICD-10-CM | POA: Diagnosis not present

## 2022-06-08 DIAGNOSIS — M62830 Muscle spasm of back: Secondary | ICD-10-CM | POA: Diagnosis not present

## 2022-06-08 DIAGNOSIS — M9901 Segmental and somatic dysfunction of cervical region: Secondary | ICD-10-CM | POA: Diagnosis not present

## 2022-06-08 DIAGNOSIS — M9903 Segmental and somatic dysfunction of lumbar region: Secondary | ICD-10-CM | POA: Diagnosis not present

## 2022-07-06 DIAGNOSIS — M9903 Segmental and somatic dysfunction of lumbar region: Secondary | ICD-10-CM | POA: Diagnosis not present

## 2022-07-06 DIAGNOSIS — M9901 Segmental and somatic dysfunction of cervical region: Secondary | ICD-10-CM | POA: Diagnosis not present

## 2022-07-06 DIAGNOSIS — M62830 Muscle spasm of back: Secondary | ICD-10-CM | POA: Diagnosis not present

## 2022-07-06 DIAGNOSIS — M9902 Segmental and somatic dysfunction of thoracic region: Secondary | ICD-10-CM | POA: Diagnosis not present

## 2022-08-03 DIAGNOSIS — M9902 Segmental and somatic dysfunction of thoracic region: Secondary | ICD-10-CM | POA: Diagnosis not present

## 2022-08-03 DIAGNOSIS — M62830 Muscle spasm of back: Secondary | ICD-10-CM | POA: Diagnosis not present

## 2022-08-03 DIAGNOSIS — M9901 Segmental and somatic dysfunction of cervical region: Secondary | ICD-10-CM | POA: Diagnosis not present

## 2022-08-03 DIAGNOSIS — M9903 Segmental and somatic dysfunction of lumbar region: Secondary | ICD-10-CM | POA: Diagnosis not present

## 2022-08-31 DIAGNOSIS — M9902 Segmental and somatic dysfunction of thoracic region: Secondary | ICD-10-CM | POA: Diagnosis not present

## 2022-08-31 DIAGNOSIS — M62830 Muscle spasm of back: Secondary | ICD-10-CM | POA: Diagnosis not present

## 2022-08-31 DIAGNOSIS — M9903 Segmental and somatic dysfunction of lumbar region: Secondary | ICD-10-CM | POA: Diagnosis not present

## 2022-08-31 DIAGNOSIS — M9901 Segmental and somatic dysfunction of cervical region: Secondary | ICD-10-CM | POA: Diagnosis not present

## 2022-09-02 ENCOUNTER — Other Ambulatory Visit: Payer: Self-pay | Admitting: Family Medicine

## 2022-09-02 NOTE — Telephone Encounter (Signed)
Pt called about this to see if Dr Dettinger could refill today if possible? Please advise.

## 2022-09-02 NOTE — Telephone Encounter (Signed)
It has been pretty long since he has been seen for physical exam since April he has been seen by me at all.  Needs an appointment

## 2022-09-08 ENCOUNTER — Telehealth: Payer: Federal, State, Local not specified - PPO | Admitting: Family Medicine

## 2022-09-13 ENCOUNTER — Other Ambulatory Visit: Payer: Self-pay | Admitting: Family Medicine

## 2022-09-28 DIAGNOSIS — M9902 Segmental and somatic dysfunction of thoracic region: Secondary | ICD-10-CM | POA: Diagnosis not present

## 2022-09-28 DIAGNOSIS — M9903 Segmental and somatic dysfunction of lumbar region: Secondary | ICD-10-CM | POA: Diagnosis not present

## 2022-09-28 DIAGNOSIS — M9901 Segmental and somatic dysfunction of cervical region: Secondary | ICD-10-CM | POA: Diagnosis not present

## 2022-09-28 DIAGNOSIS — M62830 Muscle spasm of back: Secondary | ICD-10-CM | POA: Diagnosis not present

## 2022-10-11 ENCOUNTER — Other Ambulatory Visit: Payer: Self-pay | Admitting: Family Medicine

## 2022-10-12 ENCOUNTER — Ambulatory Visit (INDEPENDENT_AMBULATORY_CARE_PROVIDER_SITE_OTHER): Payer: Federal, State, Local not specified - PPO | Admitting: Family Medicine

## 2022-10-12 ENCOUNTER — Encounter: Payer: Self-pay | Admitting: Family Medicine

## 2022-10-12 VITALS — BP 144/81 | HR 62 | Ht 70.0 in | Wt 180.0 lb

## 2022-10-12 DIAGNOSIS — Z0001 Encounter for general adult medical examination with abnormal findings: Secondary | ICD-10-CM

## 2022-10-12 DIAGNOSIS — Z0000 Encounter for general adult medical examination without abnormal findings: Secondary | ICD-10-CM | POA: Diagnosis not present

## 2022-10-12 DIAGNOSIS — J321 Chronic frontal sinusitis: Secondary | ICD-10-CM

## 2022-10-12 DIAGNOSIS — Z1211 Encounter for screening for malignant neoplasm of colon: Secondary | ICD-10-CM

## 2022-10-12 DIAGNOSIS — Z125 Encounter for screening for malignant neoplasm of prostate: Secondary | ICD-10-CM | POA: Diagnosis not present

## 2022-10-12 DIAGNOSIS — K219 Gastro-esophageal reflux disease without esophagitis: Secondary | ICD-10-CM | POA: Diagnosis not present

## 2022-10-12 DIAGNOSIS — Z136 Encounter for screening for cardiovascular disorders: Secondary | ICD-10-CM | POA: Diagnosis not present

## 2022-10-12 MED ORDER — MONTELUKAST SODIUM 10 MG PO TABS: 10.0000 mg | ORAL_TABLET | Freq: Every day | ORAL | 3 refills | Status: DC

## 2022-10-12 NOTE — Progress Notes (Signed)
BP (!) 144/81   Pulse 62   Ht 5\' 10"  (1.778 m)   Wt 180 lb (81.6 kg)   SpO2 99%   BMI 25.83 kg/m    Subjective:   Patient ID: George Garcia, male    DOB: [birth year], 62 y.o.   MRN: [id number]  HPI: Vanden Fawaz is a 62 y.o. male presenting on 10/12/2022 for Medical Management of Chronic Issues (CPE)   HPI Physical exam Patient denies any chest pain, shortness of breath, headaches or vision issues, abdominal complaints, diarrhea, nausea, vomiting.  Patient gets still IBS but has been stable and not any better or worse.  He also still gets chronic sinus issues and takes medicine for it and it has been stable.  Not any better or worse.  Relevant past medical, surgical, family and social history reviewed and updated as indicated. Interim medical history since our last visit reviewed. Allergies and medications reviewed and updated.  Review of Systems  Constitutional:  Negative for chills and fever.  HENT:  Positive for congestion. Negative for ear pain and tinnitus.   Eyes:  Negative for pain and discharge.  Respiratory:  Negative for cough, shortness of breath and wheezing.   Cardiovascular:  Negative for chest pain, palpitations and leg swelling.  Gastrointestinal:  Positive for abdominal distention. Negative for abdominal pain, blood in stool, constipation and diarrhea.  Genitourinary:  Negative for dysuria and hematuria.  Skin:  Negative for rash.  Neurological:  Negative for dizziness, weakness and headaches.  Psychiatric/Behavioral:  Negative for suicidal ideas.   All other systems reviewed and are negative.   Per HPI unless specifically indicated above   Allergies as of 10/12/2022   No Known Allergies      Medication List        Accurate as of October 12, 2022  1:11 PM. If you have any questions, ask your nurse or doctor.          albuterol 108 (90 Base) MCG/ACT inhaler Commonly known as: VENTOLIN HFA Inhale 2 puffs into the lungs every 6 (six) hours as needed  for wheezing or shortness of breath.   dicyclomine 20 MG tablet Commonly known as: BENTYL TAKE 1 TABLET (20 MG TOTAL) BY MOUTH EVERY 6 (SIX) HOURS. What changed: when to take this   Duexis 800-26.6 MG Tabs Generic drug: Ibuprofen-Famotidine Take by mouth as needed.   fexofenadine-pseudoephedrine 180-240 MG 24 hr tablet Commonly known as: ALLEGRA-D 24 Take 1 tablet by mouth every evening. For allergy and congestion   fluticasone 50 MCG/ACT nasal spray Commonly known as: FLONASE PLACE 1 SPRAY INTO BOTH NOSTRILS 2 (TWO) TIMES DAILY AS NEEDED FOR ALLERGIES OR RHINITIS.   HM Salonpas Pain Relief 1.2-5.7-6.3 % Ptch Generic drug: Camphor-Menthol-Methyl Sal Apply 1 patch topically daily as needed.   lidocaine 5 % Commonly known as: LIDODERM 1 patch as needed.   methocarbamol 500 MG tablet Commonly known as: ROBAXIN Take 1 tablet (500 mg total) by mouth 4 (four) times daily.   montelukast 10 MG tablet Commonly known as: SINGULAIR Take 1 tablet (10 mg total) by mouth at bedtime.   omeprazole 40 MG capsule Commonly known as: PRILOSEC Take 1 capsule (40 mg total) by mouth 2 (two) times daily before a meal.   ondansetron 4 MG tablet Commonly known as: ZOFRAN Take 1 tablet (4 mg total) by mouth every 8 (eight) hours as needed for nausea or vomiting.   Pennsaid 2 % Soln Generic drug: diclofenac Sodium Apply topically daily as needed.  Objective:   BP (!) 144/81   Pulse 62   Ht 5\' 10"  (1.778 m)   Wt 180 lb (81.6 kg)   SpO2 99%   BMI 25.83 kg/m   Wt Readings from Last 3 Encounters:  10/12/22 180 lb (81.6 kg)  04/06/22 186 lb (84.4 kg)  03/30/22 185 lb (83.9 kg)    Physical Exam Vitals and nursing note reviewed.  Constitutional:      General: He is not in acute distress.    Appearance: He is well-developed. He is not diaphoretic.  HENT:     Right Ear: External ear normal.     Left Ear: External ear normal.     Nose: Nose normal.     Mouth/Throat:      Pharynx: No oropharyngeal exudate.  Eyes:     General: No scleral icterus.       Right eye: No discharge.     Conjunctiva/sclera: Conjunctivae normal.     Pupils: Pupils are equal, round, and reactive to light.  Neck:     Thyroid: No thyromegaly.  Cardiovascular:     Rate and Rhythm: Normal rate and regular rhythm.     Heart sounds: Normal heart sounds. No murmur heard. Pulmonary:     Effort: Pulmonary effort is normal. No respiratory distress.     Breath sounds: Normal breath sounds. No wheezing.  Abdominal:     General: Bowel sounds are normal. There is no distension.     Palpations: Abdomen is soft.     Tenderness: There is no abdominal tenderness. There is no guarding or rebound.  Musculoskeletal:        General: Normal range of motion.     Cervical back: Neck supple.  Lymphadenopathy:     Cervical: No cervical adenopathy.  Skin:    General: Skin is warm and dry.     Findings: No rash.  Neurological:     Mental Status: He is alert and oriented to person, place, and time.     Coordination: Coordination normal.  Psychiatric:        Behavior: Behavior normal.       Assessment & Plan:   Problem List Items Addressed This Visit       Respiratory   Chronic sinusitis   Relevant Medications   montelukast (SINGULAIR) 10 MG tablet     Digestive   GERD (gastroesophageal reflux disease)   Other Visit Diagnoses     Physical exam    -  Primary   Relevant Orders   CBC with Differential/Platelet   CMP14+EGFR   Lipid panel   PSA, total and free   Colon cancer screening       Relevant Orders   Ambulatory referral to Gastroenterology       Will check blood work  No change medication.  Also placed referral to gastroenterology. Follow up plan: Return in about 1 year (around 10/13/2023), or if symptoms worsen or fail to improve, for Physical exam.  Counseling provided for all of the vaccine components Orders Placed This Encounter  Procedures   CBC with  Differential/Platelet   CMP14+EGFR   Lipid panel   PSA, total and free   Ambulatory referral to Gastroenterology    Caryl Pina, MD [facility] 10/12/2022, 1:11 PM

## 2022-10-13 LAB — LIPID PANEL
Chol/HDL Ratio: 4.5 ratio (ref 0.0–5.0)
Cholesterol, Total: 198 mg/dL (ref 100–199)
HDL: 44 mg/dL (ref >39)
LDL Chol Calc (NIH): 125 mg/dL — ABNORMAL HIGH (ref 0–99)
Triglycerides: 165 mg/dL — ABNORMAL HIGH (ref 0–149)
VLDL Cholesterol Cal: 29 mg/dL (ref 5–40)

## 2022-10-13 LAB — CBC WITH DIFFERENTIAL/PLATELET
Basophils Absolute: 0 10*3/uL (ref 0.0–0.2)
Basos: 1 %
EOS (ABSOLUTE): 0.1 10*3/uL (ref 0.0–0.4)
Eos: 2 %
Hematocrit: 45.9 % (ref 37.5–51.0)
Hemoglobin: 15 g/dL (ref 13.0–17.7)
Immature Grans (Abs): 0 10*3/uL (ref 0.0–0.1)
Immature Granulocytes: 0 %
Lymphocytes Absolute: 1.6 10*3/uL (ref 0.7–3.1)
Lymphs: 44 %
MCH: 29.4 pg (ref 26.6–33.0)
MCHC: 32.7 g/dL (ref 31.5–35.7)
MCV: 90 fL (ref 79–97)
Monocytes Absolute: 0.5 10*3/uL (ref 0.1–0.9)
Monocytes: 13 %
Neutrophils Absolute: 1.5 10*3/uL (ref 1.4–7.0)
Neutrophils: 40 %
Platelets: 226 10*3/uL (ref 150–450)
RBC: 5.11 x10E6/uL (ref 4.14–5.80)
RDW: 12.4 % (ref 11.6–15.4)
WBC: 3.7 10*3/uL (ref 3.4–10.8)

## 2022-10-13 LAB — CMP14+EGFR
ALT: 26 IU/L (ref 0–44)
AST: 24 IU/L (ref 0–40)
Albumin/Globulin Ratio: 2.7 — ABNORMAL HIGH (ref 1.2–2.2)
Albumin: 4.6 g/dL (ref 3.8–4.9)
Alkaline Phosphatase: 74 IU/L (ref 44–121)
BUN/Creatinine Ratio: 8 — ABNORMAL LOW (ref 10–24)
BUN: 8 mg/dL (ref 8–27)
Bilirubin Total: 0.3 mg/dL (ref 0.0–1.2)
CO2: 24 mmol/L (ref 20–29)
Calcium: 9.5 mg/dL (ref 8.6–10.2)
Chloride: 106 mmol/L (ref 96–106)
Creatinine, Ser: 0.96 mg/dL (ref 0.76–1.27)
Globulin, Total: 1.7 g/dL (ref 1.5–4.5)
Glucose: 75 mg/dL (ref 70–99)
Potassium: 4.8 mmol/L (ref 3.5–5.2)
Sodium: 137 mmol/L (ref 134–144)
Total Protein: 6.3 g/dL (ref 6.0–8.5)
eGFR: 90 mL/min/{1.73_m2} (ref >59)

## 2022-10-13 LAB — PSA, TOTAL AND FREE
PSA, Free Pct: 38.9 %
PSA, Free: 0.35 ng/mL
Prostate Specific Ag, Serum: 0.9 ng/mL (ref 0.0–4.0)

## 2022-10-18 ENCOUNTER — Encounter: Payer: Self-pay | Admitting: Internal Medicine

## 2022-10-26 DIAGNOSIS — M9901 Segmental and somatic dysfunction of cervical region: Secondary | ICD-10-CM | POA: Diagnosis not present

## 2022-10-26 DIAGNOSIS — M62830 Muscle spasm of back: Secondary | ICD-10-CM | POA: Diagnosis not present

## 2022-10-26 DIAGNOSIS — M9902 Segmental and somatic dysfunction of thoracic region: Secondary | ICD-10-CM | POA: Diagnosis not present

## 2022-10-26 DIAGNOSIS — M9903 Segmental and somatic dysfunction of lumbar region: Secondary | ICD-10-CM | POA: Diagnosis not present

## 2022-11-14 ENCOUNTER — Ambulatory Visit (AMBULATORY_SURGERY_CENTER): Payer: Federal, State, Local not specified - PPO | Admitting: *Deleted

## 2022-11-14 VITALS — Ht 70.0 in | Wt 175.0 lb

## 2022-11-14 DIAGNOSIS — Z1211 Encounter for screening for malignant neoplasm of colon: Secondary | ICD-10-CM

## 2022-11-14 MED ORDER — PEG 3350-KCL-NA BICARB-NACL 420 G PO SOLR: 4000.0000 mL | Freq: Once | ORAL | 0 refills | Status: AC

## 2022-11-14 NOTE — Progress Notes (Signed)
No egg or soy allergy known to patient  No issues known to pt with past sedation with any surgeries or procedures Patient denies ever being told they had issues or difficulty with intubation  No FH of Malignant Hyperthermia Pt is not on diet pills Pt is not on  home 02  Pt is not on blood thinners  Pt has  issues with constipation does use stool softener  Pt is not on dialysis Pt denies any upcoming cardiac testing Pt encouraged to use to use Singlecare or Goodrx to reduce cost  Patient's chart reviewed by Osvaldo Angst CNRA prior to previsit and patient appropriate for the [place].  Previsit completed and red dot placed by patient's name on their procedure day (on provider's schedule).  . Visit by phone Instructions reviewed with pt and pt states understanding. Instructed to review again prior to procedure. Pt states they will.  Instructions sent by mail and my chart

## 2022-11-16 ENCOUNTER — Ambulatory Visit: Payer: Federal, State, Local not specified - PPO | Admitting: Family Medicine

## 2022-11-16 VITALS — BP 130/77 | HR 65 | Ht 70.0 in | Wt 178.0 lb

## 2022-11-16 DIAGNOSIS — I10 Essential (primary) hypertension: Secondary | ICD-10-CM

## 2022-11-16 DIAGNOSIS — I159 Secondary hypertension, unspecified: Secondary | ICD-10-CM

## 2022-11-16 MED ORDER — AMLODIPINE BESYLATE 5 MG PO TABS: 5.0000 mg | ORAL_TABLET | Freq: Every day | ORAL | 3 refills | Status: DC

## 2022-11-16 NOTE — Progress Notes (Signed)
BP 130/77   Pulse 65   Ht '5\' 10"'$  (1.778 m)   Wt 80.7 kg   SpO2 98%   BMI 25.54 kg/m    Subjective:   Patient ID: George Garcia, male    DOB: [birth year], 62 y.o.   MRN: [id number]  HPI: George Garcia is a 62 y.o. male presenting on 11/16/2022 for Medical Management of Chronic Issues and Hypertension   Hypertension Patient is not currently taking any medication. Patient's blood pressure today is 130/77. Patient has been taking his blood pressure three times per day with ranges in systolic [id number] mmHg and diastolic 0000000 mmHg, with elevations above normal range at least 1 time per day. Patient denies any lightheadedness or dizziness. Patient denies headaches, blurred vision, chest pains, shortness of breath, or weakness.   Relevant past medical, surgical, family and social history reviewed and updated as indicated. Interim medical history since our last visit reviewed. Allergies and medications reviewed and updated.  Review of Systems  Constitutional:  Negative for chills, fatigue and fever.  HENT:  Negative for congestion.   Eyes:  Negative for visual disturbance.  Respiratory:  Negative for cough, chest tightness, shortness of breath and wheezing.   Cardiovascular:  Negative for chest pain, palpitations and leg swelling.  Neurological:  Negative for dizziness, weakness, light-headedness, numbness and headaches.  Psychiatric/Behavioral:  Negative for behavioral problems and confusion.     Per HPI unless specifically indicated above   Allergies as of 11/16/2022   No Known Allergies      Medication List        Accurate as of November 16, 2022  2:33 PM. If you have any questions, ask your nurse or doctor.          albuterol 108 (90 Base) MCG/ACT inhaler Commonly known as: VENTOLIN HFA Inhale 2 puffs into the lungs every 6 (six) hours as needed for wheezing or shortness of breath.   amLODipine 5 MG tablet Commonly known as: NORVASC Take 1 tablet (5 mg total) by mouth  daily.   dicyclomine 20 MG tablet Commonly known as: BENTYL TAKE 1 TABLET (20 MG TOTAL) BY MOUTH EVERY 6 (SIX) HOURS. What changed: when to take this   Duexis 800-26.6 MG Tabs Generic drug: Ibuprofen-Famotidine Take by mouth as needed.   fexofenadine-pseudoephedrine 180-240 MG 24 hr tablet Commonly known as: ALLEGRA-D 24 Take 1 tablet by mouth every evening. For allergy and congestion   fluticasone 50 MCG/ACT nasal spray Commonly known as: FLONASE PLACE 1 SPRAY INTO BOTH NOSTRILS 2 (TWO) TIMES DAILY AS NEEDED FOR ALLERGIES OR RHINITIS.   HM Salonpas Pain Relief 1.2-5.7-6.3 % Ptch Generic drug: Camphor-Menthol-Methyl Sal Apply 1 patch topically daily as needed.   hydrocortisone 25 MG suppository Commonly known as: ANUSOL-HC Place 25 mg rectally 2 (two) times daily.   ibuprofen 800 MG tablet Commonly known as: ADVIL Take 800 mg by mouth 3 (three) times daily.   lidocaine 5 % Commonly known as: LIDODERM 1 patch as needed.   methocarbamol 500 MG tablet Commonly known as: ROBAXIN Take 1 tablet (500 mg total) by mouth 4 (four) times daily.   montelukast 10 MG tablet Commonly known as: SINGULAIR Take 1 tablet (10 mg total) by mouth at bedtime.   omeprazole 40 MG capsule Commonly known as: PRILOSEC Take 1 capsule (40 mg total) by mouth 2 (two) times daily before a meal.   ondansetron 4 MG tablet Commonly known as: ZOFRAN Take 1 tablet (4 mg total) by mouth every 8 (eight)  hours as needed for nausea or vomiting.   Pennsaid 2 % Soln Generic drug: diclofenac Sodium Apply topically daily as needed.         Objective:   BP 130/77   Pulse 65   Ht '5\' 10"'$  (1.778 m)   Wt 80.7 kg   SpO2 98%   BMI 25.54 kg/m   Wt Readings from Last 3 Encounters:  11/16/22 80.7 kg  11/14/22 79.4 kg  10/12/22 81.6 kg    Physical Exam Vitals reviewed.  Constitutional:      Appearance: Normal appearance. He is not diaphoretic.  HENT:     Head: Normocephalic.  Eyes:      Conjunctiva/sclera: Conjunctivae normal.  Cardiovascular:     Rate and Rhythm: Normal rate and regular rhythm.     Pulses: Normal pulses.     Heart sounds: Normal heart sounds. No murmur heard. Pulmonary:     Effort: Pulmonary effort is normal.     Breath sounds: Normal breath sounds. No wheezing, rhonchi or rales.  Musculoskeletal:     Right lower leg: No edema.     Left lower leg: No edema.  Skin:    General: Skin is warm and dry.  Neurological:     Mental Status: He is alert and oriented to person, place, and time.  Psychiatric:        Mood and Affect: Mood normal.        Behavior: Behavior normal.    Assessment & Plan:   Problem List Items Addressed This Visit   None Visit Diagnoses     Secondary hypertension    -  Primary   Relevant Medications   amLODipine (NORVASC) 5 MG tablet       Patient will start amlodipine 5 mg tablet PO once daily for secondary hypertension. Patient was instructed to continue taking blood pressure at home for 2-4 weeks, and report numbers to preceptor in [place].  Follow up plan: Return in 6 months (on 05/17/2023), or if symptoms worsen or fail to improve, for Hypertension recheck.   Counseling provided for all of the vaccine components No orders of the defined types were placed in this encounter.  Summer Gibsland, PA-S2 [facility] 11/16/2022, 1:53 PM  I was personally present for all components of the history, physical exam and/or medical decision making.  I agree with the documentation performed by the PA student and agree with assessment and plan above.  PA student was [employer]. Caryl Pina, MD [facility] 11/23/2022, 7:59 AM    Caryl Pina, MD [facility] 11/16/2022, 2:33 PM

## 2022-11-23 DIAGNOSIS — M9902 Segmental and somatic dysfunction of thoracic region: Secondary | ICD-10-CM | POA: Diagnosis not present

## 2022-11-23 DIAGNOSIS — M62830 Muscle spasm of back: Secondary | ICD-10-CM | POA: Diagnosis not present

## 2022-11-23 DIAGNOSIS — M9903 Segmental and somatic dysfunction of lumbar region: Secondary | ICD-10-CM | POA: Diagnosis not present

## 2022-11-23 DIAGNOSIS — M9901 Segmental and somatic dysfunction of cervical region: Secondary | ICD-10-CM | POA: Diagnosis not present

## 2022-11-24 ENCOUNTER — Other Ambulatory Visit: Payer: Self-pay | Admitting: Family Medicine

## 2022-11-24 DIAGNOSIS — K219 Gastro-esophageal reflux disease without esophagitis: Secondary | ICD-10-CM

## 2022-12-06 ENCOUNTER — Encounter: Payer: Self-pay | Admitting: Family Medicine

## 2022-12-13 ENCOUNTER — Encounter: Payer: Self-pay | Admitting: Internal Medicine

## 2022-12-13 ENCOUNTER — Ambulatory Visit (AMBULATORY_SURGERY_CENTER): Payer: Federal, State, Local not specified - PPO | Admitting: Internal Medicine

## 2022-12-13 VITALS — BP 113/74 | HR 84 | Temp 99.3°F | Resp 12 | Ht 70.0 in | Wt 175.0 lb

## 2022-12-13 DIAGNOSIS — Z1211 Encounter for screening for malignant neoplasm of colon: Secondary | ICD-10-CM

## 2022-12-13 DIAGNOSIS — D123 Benign neoplasm of transverse colon: Secondary | ICD-10-CM

## 2022-12-13 MED ORDER — SODIUM CHLORIDE 0.9 % IV SOLN: 500.0000 mL | Freq: Once | INTRAVENOUS | Status: DC

## 2022-12-13 NOTE — Patient Instructions (Signed)
Thank you for letting us take care of your healthcare needs today. You may resume previous medications. Await pathology results.    YOU HAD AN ENDOSCOPIC PROCEDURE TODAY AT [facility]:   Refer to the procedure report that was given to you for any specific questions about what was found during the examination.  If the procedure report does not answer your questions, please call your gastroenterologist to clarify.  If you requested that your care partner not be given the details of your procedure findings, then the procedure report has been included in a sealed envelope for you to review at your convenience later.  YOU SHOULD EXPECT: Some feelings of bloating in the abdomen. Passage of more gas than usual.  Walking can help get rid of the air that was put into your GI tract during the procedure and reduce the bloating. If you had a lower endoscopy (such as a colonoscopy or flexible sigmoidoscopy) you may notice spotting of blood in your stool or on the toilet paper. If you underwent a bowel prep for your procedure, you may not have a normal bowel movement for a few days.  Please Note:  You might notice some irritation and congestion in your nose or some drainage.  This is from the oxygen used during your procedure.  There is no need for concern and it should clear up in a day or so.  SYMPTOMS TO REPORT IMMEDIATELY:  Following lower endoscopy (colonoscopy or flexible sigmoidoscopy):  Excessive amounts of blood in the stool  Significant tenderness or worsening of abdominal pains  Swelling of the abdomen that is new, acute  Fever of 100F or higher   For urgent or emergent issues, a gastroenterologist can be reached at any hour by calling [phone]. Do not use MyChart messaging for urgent concerns.    DIET:  We do recommend a small meal at first, but then you may proceed to your regular diet.  Drink plenty of fluids but you should avoid alcoholic beverages for 24  hours.  ACTIVITY:  You should plan to take it easy for the rest of today and you should NOT DRIVE or use heavy machinery until tomorrow (because of the sedation medicines used during the test).    FOLLOW UP: Our staff will call the number listed on your records the next business day following your procedure.  We will call around 7:15- 8:00 am to check on you and address any questions or concerns that you may have regarding the information given to you following your procedure. If we do not reach you, we will leave a message.     If any biopsies were taken you will be contacted by phone or by letter within the next 1-3 weeks.  Please call us at [phone] if you have not heard about the biopsies in 3 weeks.    SIGNATURES/CONFIDENTIALITY: You and/or your care partner have signed paperwork which will be entered into your electronic medical record.  These signatures attest to the fact that that the information above on your After Visit Summary has been reviewed and is understood.  Full responsibility of the confidentiality of this discharge information lies with you and/or your care-partner.

## 2022-12-13 NOTE — Progress Notes (Signed)
GASTROENTEROLOGY PROCEDURE H&P NOTE   Primary Care Physician: Dettinger, Fransisca Kaufmann, MD    Reason for Procedure:   Colon cancer screening  Plan:    Colonoscopy  Patient is appropriate for endoscopic procedure(s) in the ambulatory ([facility]) setting.  The nature of the procedure, as well as the risks, benefits, and alternatives were carefully and thoroughly reviewed with the patient. Ample time for discussion and questions allowed. The patient understood, was satisfied, and agreed to proceed.     HPI: George Garcia is a 62 y.o. male who presents for colonoscopy for evaluation of colon cancer screening.  Patient was most recently seen in the Gastroenterology Clinic on 11/17/21.  No interval change in medical history since that appointment. Please refer to that note for full details regarding GI history and clinical presentation.   Past Medical History:  Diagnosis Date   Allergy    Asthma    GERD (gastroesophageal reflux disease)    Hypertension    IBS (irritable bowel syndrome)    [facility] spotted fever    2020 and 2021   Wears glasses     Past Surgical History:  Procedure Laterality Date   colonscopy  2019   HEMORRHOID SURGERY N/A 09/01/2021   Procedure: SINGLE COLUMN HEMORRHOIDECTOMY;  Surgeon: Leighton Ruff, MD;  Location: [facility];  Service: General;  Laterality: N/A;   SHOULDER SURGERY Right 2007   rotator cuff repair    Prior to Admission medications   Medication Sig Start Date End Date Taking? Authorizing Provider  amLODipine (NORVASC) 5 MG tablet Take 1 tablet (5 mg total) by mouth daily. 11/16/22  Yes Dettinger, Fransisca Kaufmann, MD  Camphor-Menthol-Methyl Sal (HM SALONPAS PAIN RELIEF) 1.2-5.7-6.3 % PTCH Apply 1 patch topically daily as needed. 01/06/20  Yes Dettinger, Fransisca Kaufmann, MD  fexofenadine-pseudoephedrine (ALLEGRA-D 24) 180-240 MG 24 hr tablet Take 1 tablet by mouth every evening. For allergy and congestion 02/15/22  Yes Stacks, Cletus Gash, MD   fluticasone (FLONASE) 50 MCG/ACT nasal spray PLACE 1 SPRAY INTO BOTH NOSTRILS 2 (TWO) TIMES DAILY AS NEEDED FOR ALLERGIES OR RHINITIS. 11/14/19  Yes Dettinger, Fransisca Kaufmann, MD  ibuprofen (ADVIL) 800 MG tablet Take 800 mg by mouth 3 (three) times daily. 11/06/22  Yes [provider]  Ibuprofen-Famotidine (DUEXIS) 800-26.6 MG TABS Take by mouth as needed.   Yes [provider]  lactulose (CHRONULAC) 10 GM/15ML solution Take 20 g by mouth 2 (two) times daily. 11/17/22  Yes [provider]  montelukast (SINGULAIR) 10 MG tablet Take 1 tablet (10 mg total) by mouth at bedtime. 10/12/22  Yes Dettinger, Fransisca Kaufmann, MD  omeprazole (PRILOSEC) 40 MG capsule TAKE 1 CAPSULE BY MOUTH TWICE A DAY BEFORE A MEAL 11/24/22  Yes Dettinger, Fransisca Kaufmann, MD  albuterol (VENTOLIN HFA) 108 (90 Base) MCG/ACT inhaler Inhale 2 puffs into the lungs every 6 (six) hours as needed for wheezing or shortness of breath. 10/06/19   Dettinger, Fransisca Kaufmann, MD  Diclofenac Sodium (PENNSAID) 2 % SOLN Apply topically daily as needed.    [provider]  dicyclomine (BENTYL) 20 MG tablet TAKE 1 TABLET (20 MG TOTAL) BY MOUTH EVERY 6 (SIX) HOURS. Patient taking differently: Take 20 mg by mouth 2 (two) times daily. 09/23/19   Dettinger, Fransisca Kaufmann, MD  hydrocortisone (ANUSOL-HC) 25 MG suppository Place 25 mg rectally 2 (two) times daily.    [provider]  lidocaine (LIDODERM) 5 % 1 patch as needed. 04/14/20   [provider]  methocarbamol (ROBAXIN) 500  MG tablet Take 1 tablet (500 mg total) by mouth 4 (four) times daily. 03/30/22   Ivy Lynn, NP  ondansetron (ZOFRAN) 4 MG tablet Take 1 tablet (4 mg total) by mouth every 8 (eight) hours as needed for nausea or vomiting. 02/15/22   Claretta Fraise, MD    Current Outpatient Medications  Medication Sig Dispense Refill   amLODipine (NORVASC) 5 MG tablet Take 1 tablet (5 mg total) by mouth daily. 90 tablet 3   Camphor-Menthol-Methyl Sal (HM SALONPAS PAIN  RELIEF) 1.2-5.7-6.3 % PTCH Apply 1 patch topically daily as needed. 40 patch 11   fexofenadine-pseudoephedrine (ALLEGRA-D 24) 180-240 MG 24 hr tablet Take 1 tablet by mouth every evening. For allergy and congestion 30 tablet 11   fluticasone (FLONASE) 50 MCG/ACT nasal spray PLACE 1 SPRAY INTO BOTH NOSTRILS 2 (TWO) TIMES DAILY AS NEEDED FOR ALLERGIES OR RHINITIS. 48 mL 1   ibuprofen (ADVIL) 800 MG tablet Take 800 mg by mouth 3 (three) times daily.     Ibuprofen-Famotidine (DUEXIS) 800-26.6 MG TABS Take by mouth as needed.     lactulose (CHRONULAC) 10 GM/15ML solution Take 20 g by mouth 2 (two) times daily.     montelukast (SINGULAIR) 10 MG tablet Take 1 tablet (10 mg total) by mouth at bedtime. 90 tablet 3   omeprazole (PRILOSEC) 40 MG capsule TAKE 1 CAPSULE BY MOUTH TWICE A DAY BEFORE A MEAL 180 capsule 1   albuterol (VENTOLIN HFA) 108 (90 Base) MCG/ACT inhaler Inhale 2 puffs into the lungs every 6 (six) hours as needed for wheezing or shortness of breath. 18 g 3   Diclofenac Sodium (PENNSAID) 2 % SOLN Apply topically daily as needed.     dicyclomine (BENTYL) 20 MG tablet TAKE 1 TABLET (20 MG TOTAL) BY MOUTH EVERY 6 (SIX) HOURS. (Patient taking differently: Take 20 mg by mouth 2 (two) times daily.) 90 tablet 3   hydrocortisone (ANUSOL-HC) 25 MG suppository Place 25 mg rectally 2 (two) times daily.     lidocaine (LIDODERM) 5 % 1 patch as needed.     methocarbamol (ROBAXIN) 500 MG tablet Take 1 tablet (500 mg total) by mouth 4 (four) times daily. 30 tablet 1   ondansetron (ZOFRAN) 4 MG tablet Take 1 tablet (4 mg total) by mouth every 8 (eight) hours as needed for nausea or vomiting. 30 tablet 2   Current Facility-Administered Medications  Medication Dose Route Frequency Provider Last Rate Last Admin   0.9 %  sodium chloride infusion  500 mL Intravenous Once Sharyn Creamer, MD        Allergies as of 12/13/2022   (No Known Allergies)    Family History  Problem Relation Age of Onset   Stroke  Mother    Diabetes Mother    Stroke Father    Kidney disease Sister    Kidney disease Brother    Fibroids Daughter    Healthy Daughter    Healthy Daughter    Healthy Son    Colon cancer Neg Hx    Cancer - Colon Neg Hx    Rectal cancer Neg Hx    Stomach cancer Neg Hx    Esophageal cancer Neg Hx    Colon polyps Neg Hx     Social History   Socioeconomic History   Marital status: Married    Spouse name: Not on file   Number of children: 3   Years of education: Not on file   Highest education level: Not on file  Occupational  History   Occupation: retired  Tobacco Use   Smoking status: Never   Smokeless tobacco: Never  Vaping Use   Vaping Use: Never used  Substance and Sexual Activity   Alcohol use: Never   Drug use: Never   Sexual activity: Yes    Comment: married since 1999, 3 child 7, 57, 70  Other Topics Concern   Not on file  Social History Narrative   Not on file   Social Determinants of Health   Financial Resource Strain: Not on file  Food Insecurity: Not on file  Transportation Needs: Not on file  Physical Activity: Not on file  Stress: Not on file  Social Connections: Not on file  Intimate Partner Violence: Not on file    Physical Exam: Vital signs in last 24 hours: BP 118/67   Pulse 65   Temp 99.3 F (37.4 C)   Ht 5\' 10"  (1.778 m)   Wt 175 lb (79.4 kg)   SpO2 100%   BMI 25.11 kg/m  GEN: NAD EYE: Sclerae anicteric ENT: MMM CV: Non-tachycardic Pulm: No increased WOB GI: Soft NEURO:  Alert & Oriented   Christia Reading, MD [facility]   12/13/2022 9:36 AM

## 2022-12-13 NOTE — Progress Notes (Signed)
Vss nad trans to pacu 

## 2022-12-13 NOTE — Progress Notes (Signed)
Pt's states no medical or surgical changes since previsit or office visit. 

## 2022-12-13 NOTE — Op Note (Signed)
[facility] Patient Name: George Garcia Procedure Date: 12/13/2022 9:31 AM MRN: [id number] Endoscopist: Adline Mango [place] , , [id number] Age: 62 Referring MD:  Date of Birth: [birth year] Gender: Male Account #: [id number] Procedure:                Colonoscopy Indications:              Screening for colorectal malignant neoplasm Medicines:                Monitored Anesthesia Care Procedure:                Pre-Anesthesia Assessment:                           - Prior to the procedure, a History and Physical                            was performed, and patient medications and                            allergies were reviewed. The patient's tolerance of                            previous anesthesia was also reviewed. The risks                            and benefits of the procedure and the sedation                            options and risks were discussed with the patient.                            All questions were answered, and informed consent                            was obtained. Prior Anticoagulants: The patient has                            taken no anticoagulant or antiplatelet agents. ASA                            Grade Assessment: II - A patient with mild systemic                            disease. After reviewing the risks and benefits,                            the patient was deemed in satisfactory condition to                            undergo the procedure.                           After obtaining informed consent, the colonoscope  was passed under direct vision. Throughout the                            procedure, the patient's blood pressure, pulse, and                            oxygen saturations were monitored continuously. The                            CF [id number] was introduced through the anus                            and advanced to the the terminal ileum. The                            colonoscopy was  performed without difficulty. The                            patient tolerated the procedure well. The quality                            of the bowel preparation was adequate. The terminal                            ileum, ileocecal valve, appendiceal orifice, and                            rectum were photographed. Scope In: [id number] AM Scope Out: 10:02:21 AM Scope Withdrawal Time: 0 hours 9 minutes 0 seconds  Total Procedure Duration: 0 hours 15 minutes 24 seconds  Findings:                 The terminal ileum appeared normal.                           Two sessile polyps were found in the transverse                            colon. The polyps were 3 to 4 mm in size. These                            polyps were removed with a cold snare. Resection                            and retrieval were complete. Complications:            No immediate complications. Estimated Blood Loss:     Estimated blood loss was minimal. Impression:               - The examined portion of the ileum was normal.                           - Two 3 to 4 mm polyps in the transverse colon,  removed with a cold snare. Resected and retrieved. Recommendation:           - Discharge patient to home (with escort).                           - Await pathology results.                           - The findings and recommendations were discussed                            with the patient. Dr Georgian Co "[place]" [place],  12/13/2022 10:08:05 AM

## 2022-12-13 NOTE — Progress Notes (Signed)
Called to room to assist during endoscopic procedure.  Patient ID and intended procedure confirmed with present staff. Received instructions for my participation in the procedure from the performing physician.  

## 2022-12-14 ENCOUNTER — Telehealth: Payer: Self-pay | Admitting: *Deleted

## 2022-12-14 NOTE — Telephone Encounter (Signed)
  Follow up Call-    Row Labels 12/13/2022    7:55 AM  Call back number   Section Header. No data exists in this row.   Post procedure Call Back phone  #   [phone]  Permission to leave phone message   Yes     Patient questions:  Do you have a fever, pain , or abdominal swelling? No. Pain Score  0 *  Have you tolerated food without any problems? Yes.    Have you been able to return to your normal activities? Yes.    Do you have any questions about your discharge instructions: Diet   No. Medications  No. Follow up visit  No.  Do you have questions or concerns about your Care? No.  Actions: * If pain score is 4 or above: No action needed, pain <4.

## 2022-12-15 DIAGNOSIS — K080 Exfoliation of teeth due to systemic causes: Secondary | ICD-10-CM | POA: Diagnosis not present

## 2022-12-21 ENCOUNTER — Encounter: Payer: Self-pay | Admitting: Internal Medicine

## 2022-12-23 ENCOUNTER — Other Ambulatory Visit: Payer: Self-pay | Admitting: Internal Medicine

## 2022-12-23 DIAGNOSIS — M5431 Sciatica, right side: Secondary | ICD-10-CM | POA: Diagnosis not present

## 2022-12-23 DIAGNOSIS — M5451 Vertebrogenic low back pain: Secondary | ICD-10-CM | POA: Diagnosis not present

## 2022-12-23 DIAGNOSIS — M5432 Sciatica, left side: Secondary | ICD-10-CM | POA: Diagnosis not present

## 2022-12-23 DIAGNOSIS — M9903 Segmental and somatic dysfunction of lumbar region: Secondary | ICD-10-CM | POA: Diagnosis not present

## 2022-12-28 ENCOUNTER — Other Ambulatory Visit: Payer: Self-pay | Admitting: Family Medicine

## 2023-01-05 DIAGNOSIS — M5431 Sciatica, right side: Secondary | ICD-10-CM | POA: Diagnosis not present

## 2023-01-05 DIAGNOSIS — M5432 Sciatica, left side: Secondary | ICD-10-CM | POA: Diagnosis not present

## 2023-01-05 DIAGNOSIS — M9903 Segmental and somatic dysfunction of lumbar region: Secondary | ICD-10-CM | POA: Diagnosis not present

## 2023-01-05 DIAGNOSIS — M5451 Vertebrogenic low back pain: Secondary | ICD-10-CM | POA: Diagnosis not present

## 2023-01-09 DIAGNOSIS — M9903 Segmental and somatic dysfunction of lumbar region: Secondary | ICD-10-CM | POA: Diagnosis not present

## 2023-01-09 DIAGNOSIS — M5431 Sciatica, right side: Secondary | ICD-10-CM | POA: Diagnosis not present

## 2023-01-09 DIAGNOSIS — M5432 Sciatica, left side: Secondary | ICD-10-CM | POA: Diagnosis not present

## 2023-01-09 DIAGNOSIS — M5451 Vertebrogenic low back pain: Secondary | ICD-10-CM | POA: Diagnosis not present

## 2023-01-16 DIAGNOSIS — M9903 Segmental and somatic dysfunction of lumbar region: Secondary | ICD-10-CM | POA: Diagnosis not present

## 2023-01-16 DIAGNOSIS — M5432 Sciatica, left side: Secondary | ICD-10-CM | POA: Diagnosis not present

## 2023-01-16 DIAGNOSIS — M5431 Sciatica, right side: Secondary | ICD-10-CM | POA: Diagnosis not present

## 2023-01-16 DIAGNOSIS — M5451 Vertebrogenic low back pain: Secondary | ICD-10-CM | POA: Diagnosis not present

## 2023-01-23 DIAGNOSIS — M9903 Segmental and somatic dysfunction of lumbar region: Secondary | ICD-10-CM | POA: Diagnosis not present

## 2023-01-23 DIAGNOSIS — M5431 Sciatica, right side: Secondary | ICD-10-CM | POA: Diagnosis not present

## 2023-01-23 DIAGNOSIS — M5451 Vertebrogenic low back pain: Secondary | ICD-10-CM | POA: Diagnosis not present

## 2023-01-23 DIAGNOSIS — M5432 Sciatica, left side: Secondary | ICD-10-CM | POA: Diagnosis not present

## 2023-01-30 DIAGNOSIS — M5432 Sciatica, left side: Secondary | ICD-10-CM | POA: Diagnosis not present

## 2023-01-30 DIAGNOSIS — M5431 Sciatica, right side: Secondary | ICD-10-CM | POA: Diagnosis not present

## 2023-01-30 DIAGNOSIS — M5451 Vertebrogenic low back pain: Secondary | ICD-10-CM | POA: Diagnosis not present

## 2023-01-30 DIAGNOSIS — M9903 Segmental and somatic dysfunction of lumbar region: Secondary | ICD-10-CM | POA: Diagnosis not present

## 2023-02-06 DIAGNOSIS — M5432 Sciatica, left side: Secondary | ICD-10-CM | POA: Diagnosis not present

## 2023-02-06 DIAGNOSIS — M5431 Sciatica, right side: Secondary | ICD-10-CM | POA: Diagnosis not present

## 2023-02-06 DIAGNOSIS — M9903 Segmental and somatic dysfunction of lumbar region: Secondary | ICD-10-CM | POA: Diagnosis not present

## 2023-02-06 DIAGNOSIS — M5451 Vertebrogenic low back pain: Secondary | ICD-10-CM | POA: Diagnosis not present

## 2023-02-07 ENCOUNTER — Other Ambulatory Visit: Payer: Self-pay | Admitting: Family Medicine

## 2023-02-07 NOTE — Telephone Encounter (Signed)
  Prescription Request  02/07/2023  Is this a "Controlled Substance" medicine? no  Have you seen your PCP in the last 2 weeks? 11/16/22  If YES, route message to pool  -  If NO, patient needs to be scheduled for appointment.  What is the name of the medication or equipment? flonase  Have you contacted your pharmacy to request a refill? yes   Which pharmacy would you like this sent to? [facility]   Patient notified that their request is being sent to the clinical staff for review and that they should receive a response within 2 business days.

## 2023-02-08 MED ORDER — FLUTICASONE PROPIONATE 50 MCG/ACT NA SUSP: 1.0000 | Freq: Two times a day (BID) | NASAL | 1 refills | Status: DC | PRN

## 2023-02-08 NOTE — Telephone Encounter (Signed)
Pt aware refill sent to pharmacy 

## 2023-02-14 DIAGNOSIS — M5432 Sciatica, left side: Secondary | ICD-10-CM | POA: Diagnosis not present

## 2023-02-14 DIAGNOSIS — M9903 Segmental and somatic dysfunction of lumbar region: Secondary | ICD-10-CM | POA: Diagnosis not present

## 2023-02-14 DIAGNOSIS — M5451 Vertebrogenic low back pain: Secondary | ICD-10-CM | POA: Diagnosis not present

## 2023-02-14 DIAGNOSIS — M5431 Sciatica, right side: Secondary | ICD-10-CM | POA: Diagnosis not present

## 2023-02-15 DIAGNOSIS — M25511 Pain in right shoulder: Secondary | ICD-10-CM | POA: Diagnosis not present

## 2023-02-20 DIAGNOSIS — M5432 Sciatica, left side: Secondary | ICD-10-CM | POA: Diagnosis not present

## 2023-02-20 DIAGNOSIS — M5431 Sciatica, right side: Secondary | ICD-10-CM | POA: Diagnosis not present

## 2023-02-20 DIAGNOSIS — M9903 Segmental and somatic dysfunction of lumbar region: Secondary | ICD-10-CM | POA: Diagnosis not present

## 2023-02-20 DIAGNOSIS — M5451 Vertebrogenic low back pain: Secondary | ICD-10-CM | POA: Diagnosis not present

## 2023-02-27 DIAGNOSIS — M9903 Segmental and somatic dysfunction of lumbar region: Secondary | ICD-10-CM | POA: Diagnosis not present

## 2023-02-27 DIAGNOSIS — M5431 Sciatica, right side: Secondary | ICD-10-CM | POA: Diagnosis not present

## 2023-02-27 DIAGNOSIS — M5451 Vertebrogenic low back pain: Secondary | ICD-10-CM | POA: Diagnosis not present

## 2023-02-27 DIAGNOSIS — M5432 Sciatica, left side: Secondary | ICD-10-CM | POA: Diagnosis not present

## 2023-03-06 DIAGNOSIS — M5451 Vertebrogenic low back pain: Secondary | ICD-10-CM | POA: Diagnosis not present

## 2023-03-06 DIAGNOSIS — M5431 Sciatica, right side: Secondary | ICD-10-CM | POA: Diagnosis not present

## 2023-03-06 DIAGNOSIS — M9903 Segmental and somatic dysfunction of lumbar region: Secondary | ICD-10-CM | POA: Diagnosis not present

## 2023-03-06 DIAGNOSIS — M5432 Sciatica, left side: Secondary | ICD-10-CM | POA: Diagnosis not present

## 2023-03-13 DIAGNOSIS — M5431 Sciatica, right side: Secondary | ICD-10-CM | POA: Diagnosis not present

## 2023-03-13 DIAGNOSIS — M5432 Sciatica, left side: Secondary | ICD-10-CM | POA: Diagnosis not present

## 2023-03-13 DIAGNOSIS — M5451 Vertebrogenic low back pain: Secondary | ICD-10-CM | POA: Diagnosis not present

## 2023-03-13 DIAGNOSIS — M9903 Segmental and somatic dysfunction of lumbar region: Secondary | ICD-10-CM | POA: Diagnosis not present

## 2023-03-20 DIAGNOSIS — M5451 Vertebrogenic low back pain: Secondary | ICD-10-CM | POA: Diagnosis not present

## 2023-03-20 DIAGNOSIS — M5431 Sciatica, right side: Secondary | ICD-10-CM | POA: Diagnosis not present

## 2023-03-20 DIAGNOSIS — M5432 Sciatica, left side: Secondary | ICD-10-CM | POA: Diagnosis not present

## 2023-03-20 DIAGNOSIS — M9903 Segmental and somatic dysfunction of lumbar region: Secondary | ICD-10-CM | POA: Diagnosis not present

## 2023-03-23 ENCOUNTER — Other Ambulatory Visit: Payer: Self-pay | Admitting: Family Medicine

## 2023-03-23 DIAGNOSIS — K219 Gastro-esophageal reflux disease without esophagitis: Secondary | ICD-10-CM

## 2023-03-27 DIAGNOSIS — M5431 Sciatica, right side: Secondary | ICD-10-CM | POA: Diagnosis not present

## 2023-03-27 DIAGNOSIS — M5432 Sciatica, left side: Secondary | ICD-10-CM | POA: Diagnosis not present

## 2023-03-27 DIAGNOSIS — M9903 Segmental and somatic dysfunction of lumbar region: Secondary | ICD-10-CM | POA: Diagnosis not present

## 2023-03-27 DIAGNOSIS — M5451 Vertebrogenic low back pain: Secondary | ICD-10-CM | POA: Diagnosis not present

## 2023-04-03 DIAGNOSIS — M9903 Segmental and somatic dysfunction of lumbar region: Secondary | ICD-10-CM | POA: Diagnosis not present

## 2023-04-03 DIAGNOSIS — M5432 Sciatica, left side: Secondary | ICD-10-CM | POA: Diagnosis not present

## 2023-04-03 DIAGNOSIS — M5431 Sciatica, right side: Secondary | ICD-10-CM | POA: Diagnosis not present

## 2023-04-03 DIAGNOSIS — M5451 Vertebrogenic low back pain: Secondary | ICD-10-CM | POA: Diagnosis not present

## 2023-04-11 ENCOUNTER — Ambulatory Visit: Payer: Federal, State, Local not specified - PPO | Admitting: Nurse Practitioner

## 2023-04-11 ENCOUNTER — Encounter: Payer: Self-pay | Admitting: Nurse Practitioner

## 2023-04-11 VITALS — BP 110/69 | HR 72 | Temp 97.0°F | Resp 20 | Ht 70.0 in | Wt 172.0 lb

## 2023-04-11 DIAGNOSIS — R5383 Other fatigue: Secondary | ICD-10-CM

## 2023-04-11 NOTE — Patient Instructions (Signed)
Fatigue If you have fatigue, you feel tired all the time and have a lack of energy or a lack of motivation. Fatigue may make it difficult to start or complete tasks because of exhaustion. Occasional or mild fatigue is often a normal response to activity or life. However, long-term (chronic) or extreme fatigue may be a symptom of a medical condition such as: Depression. Not having enough red blood cells or hemoglobin in the blood (anemia). A problem with a small gland located in the lower front part of the neck (thyroid disorder). Rheumatologic conditions. These are problems related to the body's defense system (immune system). Infections, especially certain viral infections. Fatigue can also lead to negative health outcomes over time. Follow these instructions at home: Medicines Take over-the-counter and prescription medicines only as told by your health care provider. Take a multivitamin if told by your health care provider. Do not use herbal or dietary supplements unless they are approved by your health care provider. Eating and drinking  Avoid heavy meals in the evening. Eat a well-balanced diet, which includes lean proteins, whole grains, plenty of fruits and vegetables, and low-fat dairy products. Avoid eating or drinking too many products with caffeine in them. Avoid alcohol. Drink enough fluid to keep your urine pale yellow. Activity  Exercise regularly, as told by your health care provider. Use or practice techniques to help you relax, such as yoga, tai chi, meditation, or massage therapy. Lifestyle Change situations that cause you stress. Try to keep your work and personal schedules in balance. Do not use recreational or illegal drugs. General instructions Monitor your fatigue for any changes. Go to bed and get up at the same time every day. Avoid fatigue by pacing yourself during the day and getting enough sleep at night. Maintain a healthy weight. Contact a health care  provider if: Your fatigue does not get better. You have a fever. You suddenly lose or gain weight. You have headaches. You have trouble falling asleep or sleeping through the night. You feel angry, guilty, anxious, or sad. You have swelling in your legs or another part of your body. Get help right away if: You feel confused, feel like you might faint, or faint. Your vision is blurry or you have a severe headache. You have severe pain in your abdomen, your back, or the area between your waist and hips (pelvis). You have chest pain, shortness of breath, or an irregular or fast heartbeat. You are unable to urinate, or you urinate less than normal. You have abnormal bleeding from the rectum, nose, lungs, nipples, or, if you are male, the vagina. You vomit blood. You have thoughts about hurting yourself or others. These symptoms may be an emergency. Get help right away. Call 911. Do not wait to see if the symptoms will go away. Do not drive yourself to the hospital. Get help right away if you feel like you may hurt yourself or others, or have thoughts about taking your own life. Go to your nearest emergency room or: Call 911. Call the National Suicide Prevention Lifeline at 1-800-273-8255 or 988. This is open 24 hours a day. Text the Crisis Text Line at 741741. Summary If you have fatigue, you feel tired all the time and have a lack of energy or a lack of motivation. Fatigue may make it difficult to start or complete tasks because of exhaustion. Long-term (chronic) or extreme fatigue may be a symptom of a medical condition. Exercise regularly, as told by your health care provider.   Change situations that cause you stress. Try to keep your work and personal schedules in balance. This information is not intended to replace advice given to you by your health care provider. Make sure you discuss any questions you have with your health care provider. Document Revised: 06/28/2021 Document  Reviewed: 06/28/2021 Elsevier Patient Education  2024 Elsevier Inc.  

## 2023-04-11 NOTE — Progress Notes (Signed)
Subjective:    Patient ID: George Garcia, male    DOB: 07-21-1961, 62 y.o.   MRN: [id number]   Chief Complaint: Fatigue (Wants to discuss getting B12 shots)   HPI  Patient comes in today c/o fatigue. Has been going on for some time. Feels sleepy whenever he seats down. Sleeping fine, says he does snore. Had sleep study 10 years ago and was negative for sleep apnea. He says he works out 3 days a week, but lately has not been able to finish work outs.     04/11/2023    9:19 AM 11/16/2022    1:15 PM 10/12/2022   12:54 PM  Depression screen PHQ 2/9  Decreased Interest 0 0 0  Down, Depressed, Hopeless 0 0 0  PHQ - 2 Score 0 0 0  Altered sleeping 0 0 0  Tired, decreased energy 0 1 1  Change in appetite 0 0 0  Feeling bad or failure about yourself  0 0 0  Trouble concentrating 0 0 0  Moving slowly or fidgety/restless 0 0 0  Suicidal thoughts 0 0 0  PHQ-9 Score 0 1 1  Difficult doing work/chores Not difficult at all Not difficult at all Not difficult at all    Patient Active Problem List   Diagnosis Date Noted   DDD (degenerative disc disease), lumbar 07/04/2019   Chronic midline low back pain with right-sided sciatica 07/04/2019   Disease related peripheral neuropathy 04/08/2019   GERD (gastroesophageal reflux disease) 12/05/2018   Impingement syndrome of left shoulder 09/05/2018   IBS (irritable bowel syndrome) 09/05/2018   Chronic sinusitis 09/05/2018       Review of Systems  Constitutional:  Positive for fatigue. Negative for appetite change and unexpected weight change.  Respiratory:  Negative for shortness of breath.   Cardiovascular:  Negative for chest pain, palpitations and leg swelling.       Objective:   Physical Exam Constitutional:      Appearance: Normal appearance.  Cardiovascular:     Rate and Rhythm: Normal rate and regular rhythm.     Heart sounds: Normal heart sounds.  Pulmonary:     Effort: Pulmonary effort is normal.     Breath sounds: Normal  breath sounds.  Skin:    General: Skin is warm.  Neurological:     General: No focal deficit present.     Mental Status: He is alert and oriented to person, place, and time.  Psychiatric:        Mood and Affect: Mood normal.        Behavior: Behavior normal.     BP 110/69   Pulse 72   Temp (!) 97 F (36.1 C) (Temporal)   Resp 20   Ht 5\' 10"  (1.778 m)   Wt 172 lb (78 kg)   SpO2 100%   BMI 24.68 kg/m        Assessment & Plan:   George Garcia in today with chief complaint of Fatigue (Wants to discuss getting B12 shots)   1. Other fatigue Will talk once labs are back - Thyroid Panel With TSH - Vitamin B12 - Lyme Disease Serology w/Reflex    The above assessment and management plan was discussed with the patient. The patient verbalized understanding of and has agreed to the management plan. Patient is aware to call the clinic if symptoms persist or worsen. Patient is aware when to return to the clinic for a follow-up visit. Patient educated on when it is appropriate to  go to the emergency department.   Mary-Margaret Daphine Deutscher, FNP

## 2023-04-12 LAB — THYROID PANEL WITH TSH
Free Thyroxine Index: 2.1 (ref 1.2–4.9)
T3 Uptake Ratio: 26 % (ref 24–39)
T4, Total: 8 ug/dL (ref 4.5–12.0)
TSH: 0.799 u[IU]/mL (ref 0.450–4.500)

## 2023-04-12 LAB — VITAMIN B12: Vitamin B-12: >2000 pg/mL — ABNORMAL HIGH (ref 232–1245)

## 2023-04-12 LAB — LYME DISEASE SEROLOGY W/REFLEX: Lyme Total Antibody EIA: NEGATIVE

## 2023-04-25 ENCOUNTER — Other Ambulatory Visit: Payer: Self-pay | Admitting: Family Medicine

## 2023-04-25 DIAGNOSIS — K219 Gastro-esophageal reflux disease without esophagitis: Secondary | ICD-10-CM

## 2023-04-25 MED ORDER — OMEPRAZOLE 40 MG PO CPDR
40.0000 mg | DELAYED_RELEASE_CAPSULE | Freq: Two times a day (BID) | ORAL | 0 refills | Status: DC
Start: 2023-04-25 — End: 2023-06-07

## 2023-04-25 NOTE — Telephone Encounter (Signed)
Can he get a refill until apt?

## 2023-04-25 NOTE — Telephone Encounter (Signed)
Dettinger pt NTBS 30-d given 03/23/23

## 2023-04-25 NOTE — Addendum Note (Signed)
Addended by: Julious Payer D on: 04/25/2023 09:53 AM   Modules accepted: Orders

## 2023-04-25 NOTE — Telephone Encounter (Signed)
Pt scheduled for 06/07/2023. Can he get a refill until apt?

## 2023-06-07 ENCOUNTER — Encounter: Payer: Self-pay | Admitting: Family Medicine

## 2023-06-07 ENCOUNTER — Ambulatory Visit: Payer: Federal, State, Local not specified - PPO | Admitting: Family Medicine

## 2023-06-07 VITALS — BP 121/81 | HR 53 | Temp 97.3°F | Ht 70.0 in | Wt 175.0 lb

## 2023-06-07 DIAGNOSIS — E782 Mixed hyperlipidemia: Secondary | ICD-10-CM

## 2023-06-07 DIAGNOSIS — R519 Headache, unspecified: Secondary | ICD-10-CM | POA: Diagnosis not present

## 2023-06-07 DIAGNOSIS — K219 Gastro-esophageal reflux disease without esophagitis: Secondary | ICD-10-CM | POA: Diagnosis not present

## 2023-06-07 DIAGNOSIS — Z23 Encounter for immunization: Secondary | ICD-10-CM | POA: Diagnosis not present

## 2023-06-07 DIAGNOSIS — J321 Chronic frontal sinusitis: Secondary | ICD-10-CM

## 2023-06-07 DIAGNOSIS — E785 Hyperlipidemia, unspecified: Secondary | ICD-10-CM | POA: Insufficient documentation

## 2023-06-07 MED ORDER — LORATADINE 10 MG PO TABS: 10.0000 mg | ORAL_TABLET | Freq: Every day | ORAL | 3 refills | Status: DC

## 2023-06-07 MED ORDER — OMEPRAZOLE 40 MG PO CPDR
40.0000 mg | DELAYED_RELEASE_CAPSULE | Freq: Two times a day (BID) | ORAL | 3 refills | Status: DC
Start: 2023-06-07 — End: 2024-06-10

## 2023-06-07 NOTE — Progress Notes (Signed)
BP 121/81   Pulse (!) 53   Temp (!) 97.3 F (36.3 C) (Temporal)   Ht 5\' 10"  (1.778 m)   Wt 175 lb (79.4 kg)   SpO2 99%   BMI 25.11 kg/m    Subjective:   Patient ID: George Garcia, male    DOB: 1960-10-08, 62 y.o.   MRN: [id number]  HPI: George Garcia is a 62 y.o. male presenting on 06/07/2023 for Medication Refill and Allergic Rhinitis  (Does not know if Singulair is working )   HPI Chronic sinusitis and allergic rhinitis Patient is currently using Allegra and Singulair for chronic sinusitis and allergic rhinitis and also uses Flonase.  He has been out of his Joyce [place] because his insurance was not paying for it and has been having more sinus and congestion symptoms more often.  This time has been going on for the past few days and he is having some sinus headaches and some drainage and congestion.  He does still take the Singulair.  GERD Patient is currently on omeprazole.  She denies any major symptoms or abdominal pain or belching or burping. She denies any blood in her stool or lightheadedness or dizziness.   Hyperlipidemia Patient is coming in for recheck of his hyperlipidemia. The patient is currently taking trying diet control. They deny any issues with myalgias or history of liver damage from it. They deny any focal numbness or weakness or chest pain.   Relevant past medical, surgical, family and social history reviewed and updated as indicated. Interim medical history since our last visit reviewed. Allergies and medications reviewed and updated.  Review of Systems  Constitutional:  Negative for chills and fever.  HENT:  Positive for congestion, sinus pressure, sinus pain and voice change.   Eyes:  Negative for visual disturbance.  Respiratory:  Positive for cough. Negative for shortness of breath and wheezing.   Cardiovascular:  Negative for chest pain and leg swelling.  Musculoskeletal:  Negative for back pain and gait problem.  Skin:  Negative for rash.  Neurological:   Positive for headaches.  All other systems reviewed and are negative.   Per HPI unless specifically indicated above   Allergies as of 06/07/2023   No Known Allergies      Medication List        Accurate as of June 07, 2023 12:01 PM. If you have any questions, ask your nurse or doctor.          STOP taking these medications    fexofenadine-pseudoephedrine 180-240 MG 24 hr tablet Commonly known as: ALLEGRA-D 24 Stopped by: Elige Radon Rayola Everhart       TAKE these medications    albuterol 108 (90 Base) MCG/ACT inhaler Commonly known as: VENTOLIN HFA Inhale 2 puffs into the lungs every 6 (six) hours as needed for wheezing or shortness of breath.   amLODipine 5 MG tablet Commonly known as: NORVASC Take 1 tablet (5 mg total) by mouth daily.   dicyclomine 20 MG tablet Commonly known as: BENTYL TAKE 1 TABLET (20 MG TOTAL) BY MOUTH EVERY 6 (SIX) HOURS. What changed: when to take this   Duexis 800-26.6 MG Tabs Generic drug: Ibuprofen-Famotidine Take by mouth as needed.   fluticasone 50 MCG/ACT nasal spray Commonly known as: FLONASE Place 1 spray into both nostrils 2 (two) times daily as needed for allergies or rhinitis.   HM Salonpas Pain Relief 1.2-5.7-6.3 % Ptch Generic drug: Camphor-Menthol-Methyl Sal Apply 1 patch topically daily as needed.   hydrocortisone 25 MG  suppository Commonly known as: ANUSOL-HC UNWRAP AND INSERT 1 SUPPOSITORY 3 TIMES DAILY RECTALLY   ibuprofen 800 MG tablet Commonly known as: ADVIL Take 800 mg by mouth 3 (three) times daily.   lactulose 10 GM/15ML solution Commonly known as: CHRONULAC TAKE 30 MLS (20 G TOTAL) BY MOUTH 2 (TWO) TIMES DAILY.   lidocaine 5 % Commonly known as: LIDODERM 1 patch as needed.   loratadine 10 MG tablet Commonly known as: CLARITIN Take 1 tablet (10 mg total) by mouth daily. Started by: Elige Radon Alyscia Carmon   methocarbamol 500 MG tablet Commonly known as: ROBAXIN Take 1 tablet (500 mg total) by  mouth 4 (four) times daily.   montelukast 10 MG tablet Commonly known as: SINGULAIR Take 1 tablet (10 mg total) by mouth at bedtime.   omeprazole 40 MG capsule Commonly known as: PRILOSEC Take 1 capsule (40 mg total) by mouth in the morning and at bedtime.   ondansetron 4 MG tablet Commonly known as: ZOFRAN Take 1 tablet (4 mg total) by mouth every 8 (eight) hours as needed for nausea or vomiting.   Pennsaid 2 % Soln Generic drug: diclofenac Sodium Apply topically daily as needed.         Objective:   BP 121/81   Pulse (!) 53   Temp (!) 97.3 F (36.3 C) (Temporal)   Ht 5\' 10"  (1.778 m)   Wt 175 lb (79.4 kg)   SpO2 99%   BMI 25.11 kg/m   Wt Readings from Last 3 Encounters:  06/07/23 175 lb (79.4 kg)  04/11/23 172 lb (78 kg)  12/13/22 175 lb (79.4 kg)    Physical Exam Vitals and nursing note reviewed.  Constitutional:      General: He is not in acute distress.    Appearance: He is well-developed. He is not diaphoretic.  Eyes:     General: No scleral icterus.    Conjunctiva/sclera: Conjunctivae normal.  Neck:     Thyroid: No thyromegaly.  Cardiovascular:     Rate and Rhythm: Normal rate and regular rhythm.     Heart sounds: Normal heart sounds. No murmur heard. Pulmonary:     Effort: Pulmonary effort is normal. No respiratory distress.     Breath sounds: Normal breath sounds. No wheezing.  Musculoskeletal:        General: No swelling. Normal range of motion.     Cervical back: Neck supple.  Lymphadenopathy:     Cervical: No cervical adenopathy.  Skin:    General: Skin is warm and dry.     Findings: No rash.  Neurological:     Mental Status: He is alert and oriented to person, place, and time.     Coordination: Coordination normal.  Psychiatric:        Behavior: Behavior normal.       Assessment & Plan:   Problem List Items Addressed This Visit       Respiratory   Chronic sinusitis   Relevant Medications   loratadine (CLARITIN) 10 MG tablet      Digestive   GERD (gastroesophageal reflux disease)   Relevant Medications   omeprazole (PRILOSEC) 40 MG capsule   Other Relevant Orders   CBC with Differential/Platelet     Other   Hyperlipidemia - Primary   Relevant Orders   CBC with Differential/Platelet   CMP14+EGFR   Lipid panel    Will add Claritin to Singulair because he was not able to get the Allegra covered by his insurance. Follow up plan: Return  in about 6 months (around 12/05/2023), or if symptoms worsen or fail to improve, for Physical exam and recheck hyperlipidemia and GERD.  Counseling provided for all of the vaccine components Orders Placed This Encounter  Procedures   CBC with Differential/Platelet   CMP14+EGFR   Lipid panel    Arville Care, MD [facility] 06/07/2023, 12:01 PM

## 2023-06-07 NOTE — Addendum Note (Signed)
Addended by: Arville Care on: 06/07/2023 12:05 PM   Modules accepted: Orders

## 2023-06-07 NOTE — Addendum Note (Signed)
Addended by: Daisy Blossom on: 06/07/2023 12:12 PM   Modules accepted: Orders

## 2023-06-08 LAB — CMP14+EGFR
ALT: 23 IU/L (ref 0–44)
AST: 25 IU/L (ref 0–40)
Albumin: 4.6 g/dL (ref 3.8–4.9)
Alkaline Phosphatase: 68 IU/L (ref 44–121)
BUN/Creatinine Ratio: 7 — ABNORMAL LOW (ref 10–24)
BUN: 7 mg/dL — ABNORMAL LOW (ref 8–27)
Bilirubin Total: 0.3 mg/dL (ref 0.0–1.2)
CO2: 24 mmol/L (ref 20–29)
Calcium: 9.6 mg/dL (ref 8.6–10.2)
Chloride: 101 mmol/L (ref 96–106)
Creatinine, Ser: 0.99 mg/dL (ref 0.76–1.27)
Globulin, Total: 2.1 g/dL (ref 1.5–4.5)
Glucose: 96 mg/dL (ref 70–99)
Potassium: 4.7 mmol/L (ref 3.5–5.2)
Sodium: 139 mmol/L (ref 134–144)
Total Protein: 6.7 g/dL (ref 6.0–8.5)
eGFR: 87 mL/min/{1.73_m2} (ref >59)

## 2023-06-08 LAB — TSH: TSH: 0.734 u[IU]/mL (ref 0.450–4.500)

## 2023-06-08 LAB — CBC WITH DIFFERENTIAL/PLATELET
Basophils Absolute: 0 10*3/uL (ref 0.0–0.2)
Basos: 1 %
EOS (ABSOLUTE): 0.1 10*3/uL (ref 0.0–0.4)
Eos: 3 %
Hematocrit: 43.8 % (ref 37.5–51.0)
Hemoglobin: 14.4 g/dL (ref 13.0–17.7)
Immature Grans (Abs): 0 10*3/uL (ref 0.0–0.1)
Immature Granulocytes: 0 %
Lymphocytes Absolute: 1.4 10*3/uL (ref 0.7–3.1)
Lymphs: 42 %
MCH: 29.8 pg (ref 26.6–33.0)
MCHC: 32.9 g/dL (ref 31.5–35.7)
MCV: 91 fL (ref 79–97)
Monocytes Absolute: 0.4 10*3/uL (ref 0.1–0.9)
Monocytes: 11 %
Neutrophils Absolute: 1.4 10*3/uL (ref 1.4–7.0)
Neutrophils: 43 %
Platelets: 220 10*3/uL (ref 150–450)
RBC: 4.84 x10E6/uL (ref 4.14–5.80)
RDW: 12.8 % (ref 11.6–15.4)
WBC: 3.3 10*3/uL — ABNORMAL LOW (ref 3.4–10.8)

## 2023-06-08 LAB — LIPID PANEL
Chol/HDL Ratio: 6 ratio — ABNORMAL HIGH (ref 0.0–5.0)
Cholesterol, Total: 239 mg/dL — ABNORMAL HIGH (ref 100–199)
HDL: 40 mg/dL (ref >39)
LDL Chol Calc (NIH): 158 mg/dL — ABNORMAL HIGH (ref 0–99)
Triglycerides: 222 mg/dL — ABNORMAL HIGH (ref 0–149)
VLDL Cholesterol Cal: 41 mg/dL — ABNORMAL HIGH (ref 5–40)

## 2023-06-14 ENCOUNTER — Other Ambulatory Visit: Payer: Self-pay | Admitting: *Deleted

## 2023-06-14 DIAGNOSIS — E782 Mixed hyperlipidemia: Secondary | ICD-10-CM

## 2023-06-14 MED ORDER — ROSUVASTATIN CALCIUM 5 MG PO TABS: 5.0000 mg | ORAL_TABLET | Freq: Every day | ORAL | 3 refills | Status: DC

## 2023-08-04 ENCOUNTER — Encounter: Payer: Self-pay | Admitting: Nurse Practitioner

## 2023-08-04 ENCOUNTER — Ambulatory Visit: Payer: Federal, State, Local not specified - PPO | Admitting: Nurse Practitioner

## 2023-08-04 VITALS — BP 128/82 | HR 80 | Temp 97.6°F | Resp 20 | Ht 70.0 in | Wt 180.0 lb

## 2023-08-04 DIAGNOSIS — R051 Acute cough: Secondary | ICD-10-CM

## 2023-08-04 DIAGNOSIS — J0101 Acute recurrent maxillary sinusitis: Secondary | ICD-10-CM

## 2023-08-04 MED ORDER — AZITHROMYCIN 250 MG PO TABS
ORAL_TABLET | ORAL | 0 refills | Status: DC
Start: 2023-08-04 — End: 2023-11-30

## 2023-08-04 MED ORDER — HYDROCODONE BIT-HOMATROP MBR 5-1.5 MG/5ML PO SOLN: 5.0000 mL | Freq: Four times a day (QID) | ORAL | 0 refills | Status: DC | PRN

## 2023-08-04 NOTE — Progress Notes (Signed)
Subjective:    Patient ID: George Garcia, male    DOB: Jul 15, 1961, 62 y.o.   MRN: [id number]   Chief Complaint: Sinus Problem   Sinus Problem This is a new problem. The current episode started 1 to 4 weeks ago. The problem has been waxing and waning since onset. There has been no fever. His pain is at a severity of 1/10. The pain is mild. Associated symptoms include congestion, coughing, sinus pressure and a sore throat. Pertinent negatives include no chills or headaches. Treatments tried: singulair and claritin along with flonase. The treatment provided mild relief.    Patient Active Problem List   Diagnosis Date Noted   Hyperlipidemia 06/07/2023   DDD (degenerative disc disease), lumbar 07/04/2019   Chronic midline low back pain with right-sided sciatica 07/04/2019   Disease related peripheral neuropathy 04/08/2019   GERD (gastroesophageal reflux disease) 12/05/2018   Impingement syndrome of left shoulder 09/05/2018   IBS (irritable bowel syndrome) 09/05/2018   Chronic sinusitis 09/05/2018       Review of Systems  Constitutional:  Negative for chills and fever.  HENT:  Positive for congestion, sinus pressure and sore throat.   Respiratory:  Positive for cough.   Neurological:  Negative for headaches.       Objective:   Physical Exam Vitals and nursing note reviewed.  Constitutional:      Appearance: Normal appearance. He is well-developed.  HENT:     Head: Normocephalic.     Nose: Nose normal.     Mouth/Throat:     Mouth: Mucous membranes are moist.     Pharynx: Oropharynx is clear.  Eyes:     Pupils: Pupils are equal, round, and reactive to light.  Neck:     Thyroid: No thyroid mass or thyromegaly.     Vascular: No carotid bruit or JVD.     Trachea: Phonation normal.  Cardiovascular:     Rate and Rhythm: Normal rate and regular rhythm.  Pulmonary:     Effort: Pulmonary effort is normal. No respiratory distress.     Breath sounds: Normal breath sounds.   Abdominal:     General: Bowel sounds are normal.     Palpations: Abdomen is soft.     Tenderness: There is no abdominal tenderness.  Musculoskeletal:        General: Normal range of motion.     Cervical back: Normal range of motion and neck supple.  Lymphadenopathy:     Cervical: No cervical adenopathy.  Skin:    General: Skin is warm and dry.  Neurological:     Mental Status: He is alert and oriented to person, place, and time.  Psychiatric:        Behavior: Behavior normal.        Thought Content: Thought content normal.        Judgment: Judgment normal.     BP 128/82   Pulse 80   Temp 97.6 F (36.4 C) (Temporal)   Resp 20   Ht 5\' 10"  (1.778 m)   Wt 180 lb (81.6 kg)   SpO2 100%   BMI 25.83 kg/m        Assessment & Plan:   George Garcia in today with chief complaint of Sinus Problem   1. Acute recurrent maxillary sinusitis 1. Take meds as prescribed 2. Use a cool mist humidifier especially during the winter months and when heat has been humid. 3. Use saline nose sprays frequently 4. Saline irrigations of the nose can be  very helpful if done frequently.  * 4X daily for 1 week*  * Use of a nettie pot can be helpful with this. Follow directions with this* 5. Drink plenty of fluids 6. Keep thermostat turn down low 7.For any cough or congestion- hycodan 8. For fever or aces or pains- take tylenol or ibuprofen appropriate for age and weight.  * for fevers greater than 101 orally you may alternate ibuprofen and tylenol every  3 hours.    - azithromycin (ZITHROMAX Z-PAK) 250 MG tablet; As directed  Dispense: 6 tablet; Refill: 0  2. Acute cough  - HYDROcodone bit-homatropine (HYCODAN) 5-1.5 MG/5ML syrup; Take 5 mLs by mouth every 6 (six) hours as needed for cough.  Dispense: 120 mL; Refill: 0    The above assessment and management plan was discussed with the patient. The patient verbalized understanding of and has agreed to the management plan. Patient is aware to  call the clinic if symptoms persist or worsen. Patient is aware when to return to the clinic for a follow-up visit. Patient educated on when it is appropriate to go to the emergency department.   Mary-Margaret Daphine Deutscher, FNP

## 2023-08-04 NOTE — Patient Instructions (Signed)

## 2023-08-07 IMAGING — DX DG CHEST 2V
2 series · 2 of 2 positions shown · non-contrast
Comparison: None

CLINICAL DATA: Left-sided chest pain beginning today

EXAM:
CHEST - 2 VIEW

[chest pa]
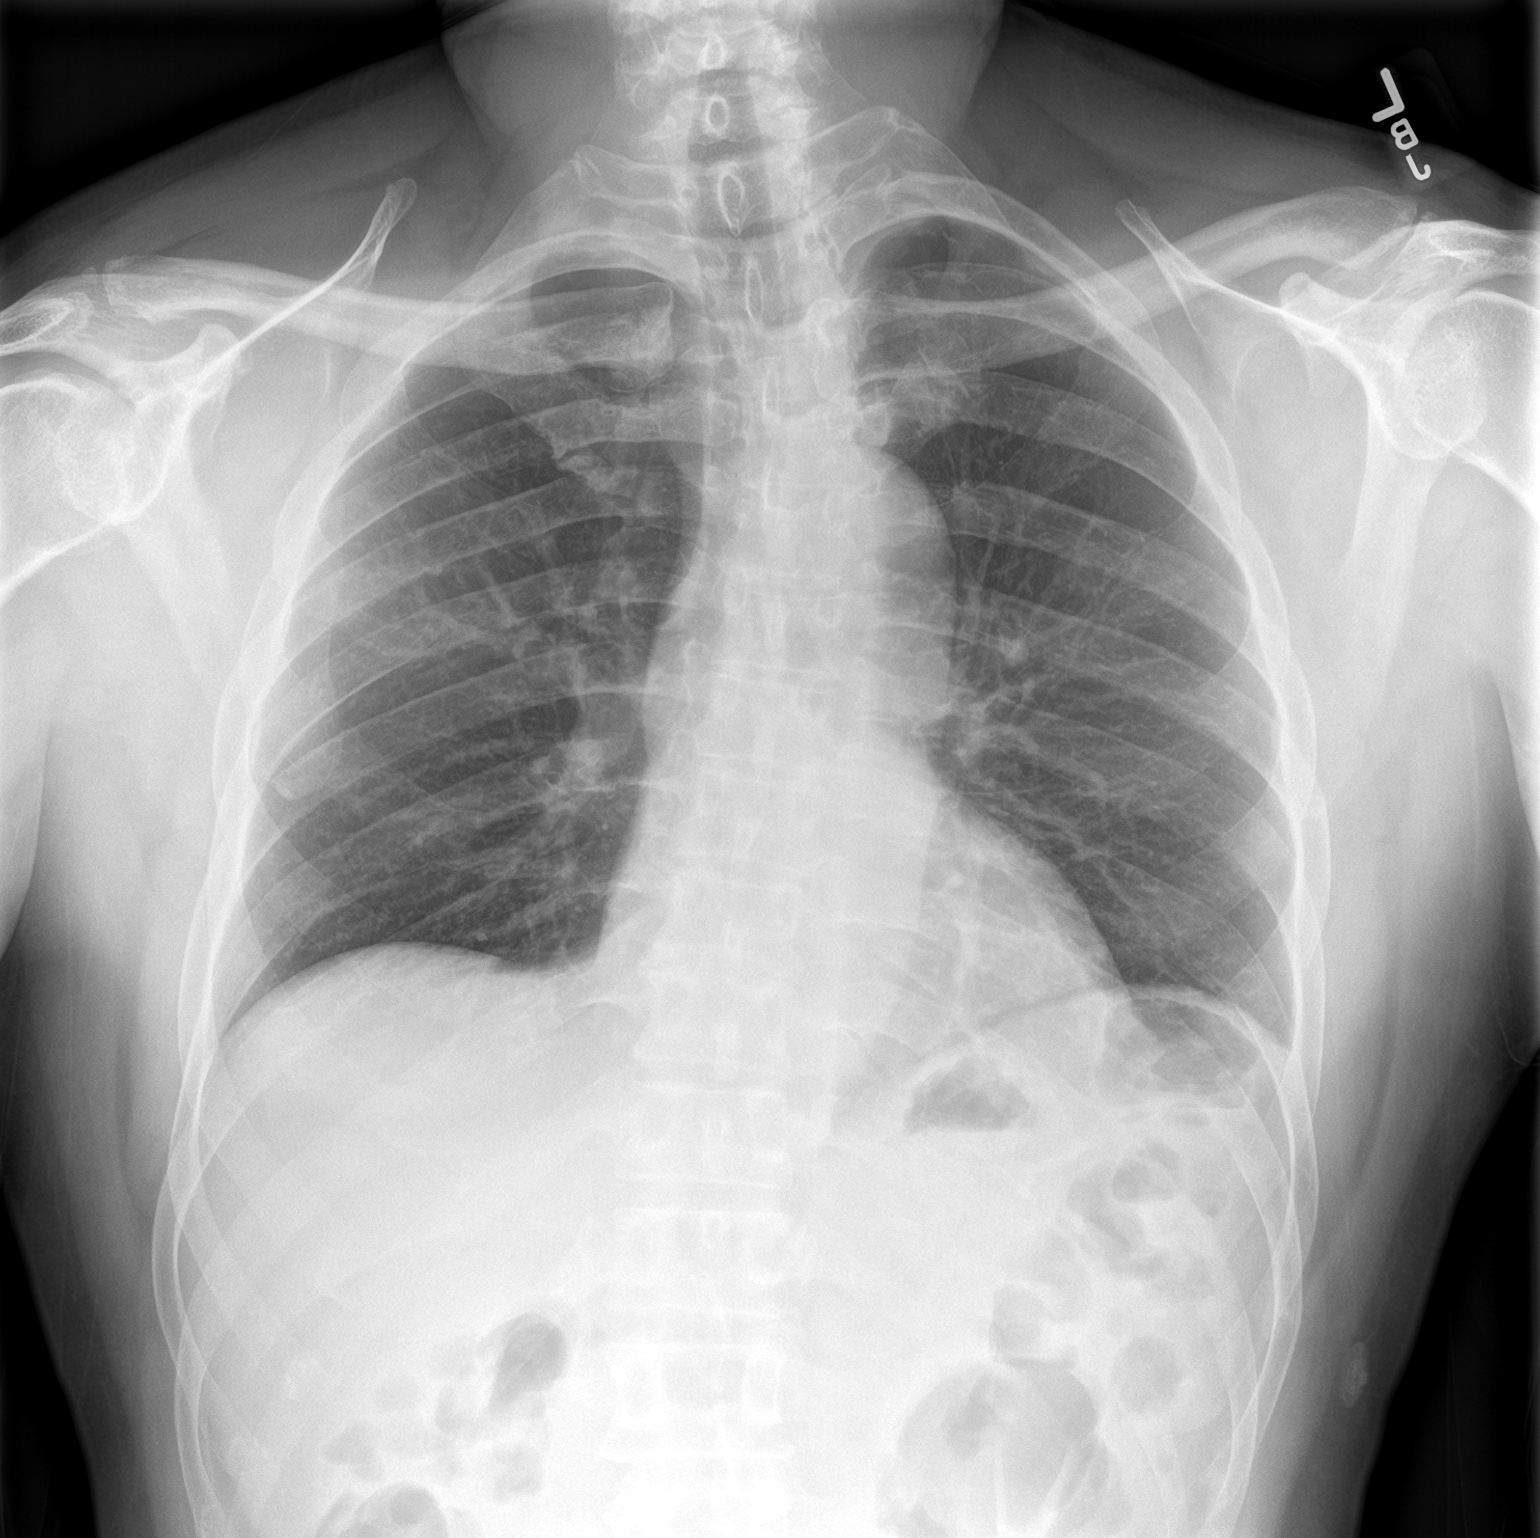

[chest lat]
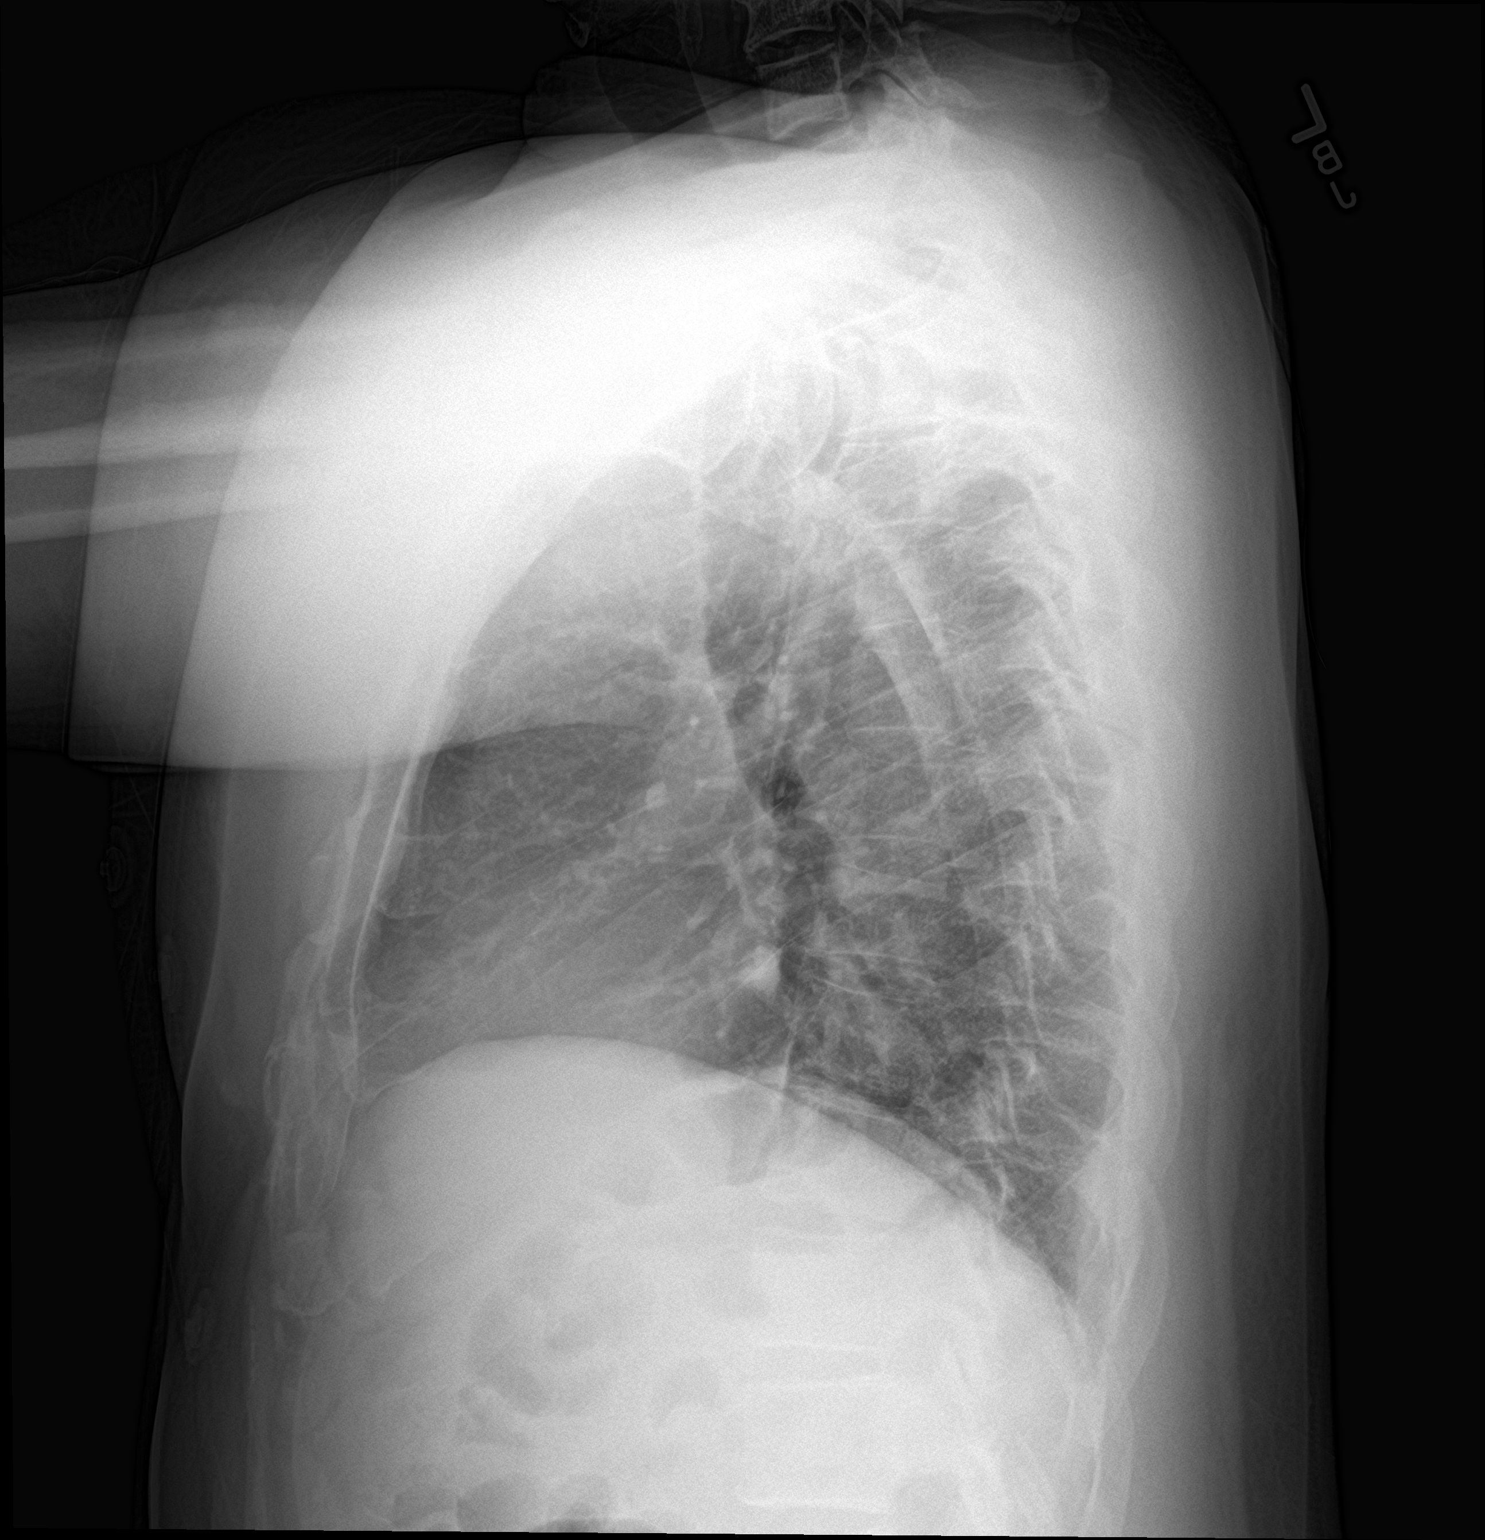

[2 of 2 positions shown; findings below may reference images not displayed]

FINDINGS: Heart and mediastinal shadows are normal. The lungs are clear. The
vascularity is normal. No effusions. Mild scoliotic curvature of the
spine.
IMPRESSION: No active cardiopulmonary disease.

## 2023-10-20 ENCOUNTER — Ambulatory Visit (INDEPENDENT_AMBULATORY_CARE_PROVIDER_SITE_OTHER): Payer: Federal, State, Local not specified - PPO | Admitting: Family Medicine

## 2023-10-20 ENCOUNTER — Encounter: Payer: Self-pay | Admitting: Family Medicine

## 2023-10-20 VITALS — BP 133/81 | HR 64 | Ht 70.0 in | Wt 180.0 lb

## 2023-10-20 DIAGNOSIS — Z125 Encounter for screening for malignant neoplasm of prostate: Secondary | ICD-10-CM | POA: Diagnosis not present

## 2023-10-20 DIAGNOSIS — Z0000 Encounter for general adult medical examination without abnormal findings: Secondary | ICD-10-CM | POA: Diagnosis not present

## 2023-10-20 DIAGNOSIS — K219 Gastro-esophageal reflux disease without esophagitis: Secondary | ICD-10-CM

## 2023-10-20 DIAGNOSIS — Z0001 Encounter for general adult medical examination with abnormal findings: Secondary | ICD-10-CM

## 2023-10-20 DIAGNOSIS — E782 Mixed hyperlipidemia: Secondary | ICD-10-CM

## 2023-10-20 LAB — LIPID PANEL

## 2023-10-20 MED ORDER — CYCLOBENZAPRINE HCL 10 MG PO TABS: 10.0000 mg | ORAL_TABLET | Freq: Three times a day (TID) | ORAL | 1 refills | Status: AC | PRN

## 2023-10-20 NOTE — Progress Notes (Signed)
BP 133/81   Pulse 64   Ht 5\' 10"  (1.778 m)   Wt 180 lb (81.6 kg)   SpO2 98%   BMI 25.83 kg/m    Subjective:   Patient ID: George Garcia, male    DOB: Mar 10, 1961, 63 y.o.   MRN: [id number]  HPI: George Garcia is a 63 y.o. male presenting on 10/20/2023 for Medical Management of Chronic Issues (CPE), Hyperlipidemia, and Hypertension   HPI Physical exam Patient denies any chest pain, shortness of breath, headaches or vision issues, abdominal complaints, diarrhea, nausea, vomiting, or joint issues.  Patient gets the occasional low back strain and has used a muscle relaxer occasionally for it and just wants a refill of the Flexeril.  Hypertension Patient is currently on amlodipine, and their blood pressure today is 133/81. Patient denies any lightheadedness or dizziness. Patient denies headaches, blurred vision, chest pains, shortness of breath, or weakness. Denies any side effects from medication and is content with current medication.   Hyperlipidemia Patient is coming in for recheck of his hyperlipidemia. The patient is currently taking rosuvastatin. They deny any issues with myalgias or history of liver damage from it. They deny any focal numbness or weakness or chest pain.   GERD Patient is currently on omeprazole.  She denies any major symptoms or abdominal pain or belching or burping. She denies any blood in her stool or lightheadedness or dizziness.   Relevant past medical, surgical, family and social history reviewed and updated as indicated. Interim medical history since our last visit reviewed. Allergies and medications reviewed and updated.  Review of Systems  Constitutional:  Negative for chills and fever.  HENT:  Negative for ear pain and tinnitus.   Eyes:  Negative for pain.  Respiratory:  Negative for cough, shortness of breath and wheezing.   Cardiovascular:  Negative for chest pain, palpitations and leg swelling.  Gastrointestinal:  Negative for abdominal pain, blood in  stool, constipation and diarrhea.  Genitourinary:  Negative for dysuria and hematuria.  Musculoskeletal:  Negative for back pain and myalgias.  Skin:  Negative for rash.  Neurological:  Negative for dizziness, weakness and headaches.  Psychiatric/Behavioral:  Negative for suicidal ideas.     Per HPI unless specifically indicated above   Allergies as of 10/20/2023   No Known Allergies      Medication List        Accurate as of October 20, 2023 11:38 AM. If you have any questions, ask your nurse or doctor.          albuterol 108 (90 Base) MCG/ACT inhaler Commonly known as: VENTOLIN HFA Inhale 2 puffs into the lungs every 6 (six) hours as needed for wheezing or shortness of breath.   amLODipine 5 MG tablet Commonly known as: NORVASC Take 1 tablet (5 mg total) by mouth daily.   azithromycin 250 MG tablet Commonly known as: Zithromax Z-Pak As directed   cyclobenzaprine 10 MG tablet Commonly known as: FLEXERIL Take 1 tablet (10 mg total) by mouth 3 (three) times daily as needed for muscle spasms. Started by: Elige Radon Esty Ahuja   dicyclomine 20 MG tablet Commonly known as: BENTYL TAKE 1 TABLET (20 MG TOTAL) BY MOUTH EVERY 6 (SIX) HOURS. What changed: when to take this   Duexis 800-26.6 MG Tabs Generic drug: Ibuprofen-Famotidine Take by mouth as needed.   fluticasone 50 MCG/ACT nasal spray Commonly known as: FLONASE Place 1 spray into both nostrils 2 (two) times daily as needed for allergies or rhinitis.  HM Salonpas Pain Relief 1.2-5.7-6.3 % Ptch Generic drug: Camphor-Menthol-Methyl Sal Apply 1 patch topically daily as needed.   HYDROcodone bit-homatropine 5-1.5 MG/5ML syrup Commonly known as: HYCODAN Take 5 mLs by mouth every 6 (six) hours as needed for cough.   hydrocortisone 25 MG suppository Commonly known as: ANUSOL-HC UNWRAP AND INSERT 1 SUPPOSITORY 3 TIMES DAILY RECTALLY   ibuprofen 800 MG tablet Commonly known as: ADVIL Take 800 mg by mouth 3  (three) times daily.   lactulose 10 GM/15ML solution Commonly known as: CHRONULAC TAKE 30 MLS (20 G TOTAL) BY MOUTH 2 (TWO) TIMES DAILY.   lidocaine 5 % Commonly known as: LIDODERM 1 patch as needed.   loratadine 10 MG tablet Commonly known as: CLARITIN Take 1 tablet (10 mg total) by mouth daily.   methocarbamol 500 MG tablet Commonly known as: ROBAXIN Take 1 tablet (500 mg total) by mouth 4 (four) times daily.   montelukast 10 MG tablet Commonly known as: SINGULAIR Take 1 tablet (10 mg total) by mouth at bedtime.   omeprazole 40 MG capsule Commonly known as: PRILOSEC Take 1 capsule (40 mg total) by mouth in the morning and at bedtime.   ondansetron 4 MG tablet Commonly known as: ZOFRAN Take 1 tablet (4 mg total) by mouth every 8 (eight) hours as needed for nausea or vomiting.   Pennsaid 2 % Soln Generic drug: diclofenac Sodium Apply topically daily as needed.   rosuvastatin 5 MG tablet Commonly known as: Crestor Take 1 tablet (5 mg total) by mouth daily.         Objective:   BP 133/81   Pulse 64   Ht 5\' 10"  (1.778 m)   Wt 180 lb (81.6 kg)   SpO2 98%   BMI 25.83 kg/m   Wt Readings from Last 3 Encounters:  10/20/23 180 lb (81.6 kg)  08/04/23 180 lb (81.6 kg)  06/07/23 175 lb (79.4 kg)    Physical Exam Vitals reviewed.  Constitutional:      General: He is not in acute distress.    Appearance: He is well-developed. He is not diaphoretic.  HENT:     Right Ear: External ear normal.     Left Ear: External ear normal.     Nose: Nose normal.     Mouth/Throat:     Pharynx: No oropharyngeal exudate.  Eyes:     General: No scleral icterus.    Conjunctiva/sclera: Conjunctivae normal.  Neck:     Thyroid: No thyromegaly.  Cardiovascular:     Rate and Rhythm: Normal rate and regular rhythm.     Heart sounds: Normal heart sounds. No murmur heard. Pulmonary:     Effort: Pulmonary effort is normal. No respiratory distress.     Breath sounds: Normal breath  sounds. No wheezing.  Abdominal:     General: Bowel sounds are normal. There is no distension.     Palpations: Abdomen is soft.     Tenderness: There is no abdominal tenderness. There is no guarding or rebound.  Musculoskeletal:        General: Normal range of motion.     Cervical back: Neck supple.  Lymphadenopathy:     Cervical: No cervical adenopathy.  Skin:    General: Skin is warm and dry.     Findings: No rash.  Neurological:     Mental Status: He is alert and oriented to person, place, and time.     Coordination: Coordination normal.  Psychiatric:  Behavior: Behavior normal.       Assessment & Plan:   Problem List Items Addressed This Visit       Digestive   GERD (gastroesophageal reflux disease)   Relevant Orders   CBC with Differential/Platelet   CMP14+EGFR   Lipid panel     Other   Hyperlipidemia   Relevant Orders   CBC with Differential/Platelet   CMP14+EGFR   Lipid panel   Other Visit Diagnoses       Physical exam    -  Primary   Relevant Orders   CBC with Differential/Platelet   CMP14+EGFR   Lipid panel   TSH   PSA, total and free     Prostate cancer screening       Relevant Orders   PSA, total and free       Blood pressure and heart rate and everything looks good today.  Will do refills of medications for him and blood work today.  Follow-up in 6 months Follow up plan: Return in about 6 months (around 04/18/2024), or if symptoms worsen or fail to improve, for Hypertension and hyperlipidemia and GERD recheck.  Counseling provided for all of the vaccine components Orders Placed This Encounter  Procedures   CBC with Differential/Platelet   CMP14+EGFR   Lipid panel   TSH   PSA, total and free    Arville Care, MD [facility] 10/20/2023, 11:38 AM

## 2023-10-21 LAB — CMP14+EGFR
ALT: 46 IU/L — ABNORMAL HIGH (ref 0–44)
AST: 40 IU/L (ref 0–40)
Albumin: 4.7 g/dL (ref 3.9–4.9)
Alkaline Phosphatase: 72 IU/L (ref 44–121)
BUN/Creatinine Ratio: 10 (ref 10–24)
BUN: 11 mg/dL (ref 8–27)
Bilirubin Total: 0.3 mg/dL (ref 0.0–1.2)
CO2: 26 mmol/L (ref 20–29)
Calcium: 9.7 mg/dL (ref 8.6–10.2)
Chloride: 102 mmol/L (ref 96–106)
Creatinine, Ser: 1.1 mg/dL (ref 0.76–1.27)
Globulin, Total: 2.1 g/dL (ref 1.5–4.5)
Glucose: 96 mg/dL (ref 70–99)
Potassium: 4.8 mmol/L (ref 3.5–5.2)
Sodium: 142 mmol/L (ref 134–144)
Total Protein: 6.8 g/dL (ref 6.0–8.5)
eGFR: 76 mL/min/{1.73_m2} (ref >59)

## 2023-10-21 LAB — CBC WITH DIFFERENTIAL/PLATELET
Basophils Absolute: 0 10*3/uL (ref 0.0–0.2)
Basos: 1 %
EOS (ABSOLUTE): 0.1 10*3/uL (ref 0.0–0.4)
Eos: 4 %
Hematocrit: 45.1 % (ref 37.5–51.0)
Hemoglobin: 14.7 g/dL (ref 13.0–17.7)
Immature Grans (Abs): 0 10*3/uL (ref 0.0–0.1)
Immature Granulocytes: 0 %
Lymphocytes Absolute: 1.7 10*3/uL (ref 0.7–3.1)
Lymphs: 48 %
MCH: 29.2 pg (ref 26.6–33.0)
MCHC: 32.6 g/dL (ref 31.5–35.7)
MCV: 90 fL (ref 79–97)
Monocytes Absolute: 0.5 10*3/uL (ref 0.1–0.9)
Monocytes: 15 %
Neutrophils Absolute: 1.1 10*3/uL — ABNORMAL LOW (ref 1.4–7.0)
Neutrophils: 32 %
Platelets: 206 10*3/uL (ref 150–450)
RBC: 5.03 x10E6/uL (ref 4.14–5.80)
RDW: 12.4 % (ref 11.6–15.4)
WBC: 3.4 10*3/uL (ref 3.4–10.8)

## 2023-10-21 LAB — LIPID PANEL
Cholesterol, Total: 155 mg/dL (ref 100–199)
HDL: 43 mg/dL (ref >39)
LDL CALC COMMENT:: 3.6 ratio (ref 0.0–5.0)
LDL Chol Calc (NIH): 88 mg/dL (ref 0–99)
Triglycerides: 137 mg/dL (ref 0–149)
VLDL Cholesterol Cal: 24 mg/dL (ref 5–40)

## 2023-10-21 LAB — PSA, TOTAL AND FREE
PSA, Free Pct: 46.7 %
PSA, Free: 0.42 ng/mL
Prostate Specific Ag, Serum: 0.9 ng/mL (ref 0.0–4.0)

## 2023-10-21 LAB — TSH: TSH: 0.818 u[IU]/mL (ref 0.450–4.500)

## 2023-10-24 ENCOUNTER — Other Ambulatory Visit: Payer: Self-pay | Admitting: Family Medicine

## 2023-10-24 DIAGNOSIS — M9903 Segmental and somatic dysfunction of lumbar region: Secondary | ICD-10-CM | POA: Diagnosis not present

## 2023-10-24 DIAGNOSIS — M5431 Sciatica, right side: Secondary | ICD-10-CM | POA: Diagnosis not present

## 2023-10-24 DIAGNOSIS — M5432 Sciatica, left side: Secondary | ICD-10-CM | POA: Diagnosis not present

## 2023-10-24 DIAGNOSIS — M5451 Vertebrogenic low back pain: Secondary | ICD-10-CM | POA: Diagnosis not present

## 2023-10-30 ENCOUNTER — Other Ambulatory Visit: Payer: Self-pay | Admitting: Family Medicine

## 2023-10-30 DIAGNOSIS — I159 Secondary hypertension, unspecified: Secondary | ICD-10-CM

## 2023-10-30 DIAGNOSIS — J321 Chronic frontal sinusitis: Secondary | ICD-10-CM

## 2023-11-01 ENCOUNTER — Encounter: Payer: Self-pay | Admitting: Family Medicine

## 2023-11-02 ENCOUNTER — Other Ambulatory Visit: Payer: Self-pay | Admitting: Family Medicine

## 2023-11-02 DIAGNOSIS — R7989 Other specified abnormal findings of blood chemistry: Secondary | ICD-10-CM

## 2023-11-24 DIAGNOSIS — M5431 Sciatica, right side: Secondary | ICD-10-CM | POA: Diagnosis not present

## 2023-11-24 DIAGNOSIS — M5432 Sciatica, left side: Secondary | ICD-10-CM | POA: Diagnosis not present

## 2023-11-24 DIAGNOSIS — M9903 Segmental and somatic dysfunction of lumbar region: Secondary | ICD-10-CM | POA: Diagnosis not present

## 2023-11-24 DIAGNOSIS — M5451 Vertebrogenic low back pain: Secondary | ICD-10-CM | POA: Diagnosis not present

## 2023-11-30 ENCOUNTER — Other Ambulatory Visit: Payer: Federal, State, Local not specified - PPO

## 2023-11-30 ENCOUNTER — Ambulatory Visit: Admitting: Family Medicine

## 2023-11-30 ENCOUNTER — Encounter: Payer: Self-pay | Admitting: Family Medicine

## 2023-11-30 VITALS — BP 132/85 | HR 68 | Ht 70.0 in | Wt 180.0 lb

## 2023-11-30 DIAGNOSIS — R7989 Other specified abnormal findings of blood chemistry: Secondary | ICD-10-CM | POA: Diagnosis not present

## 2023-11-30 DIAGNOSIS — I10 Essential (primary) hypertension: Secondary | ICD-10-CM

## 2023-11-30 NOTE — Progress Notes (Signed)
 BP 132/85   Pulse 68   Ht 5\' 10"  (1.778 m)   Wt 180 lb (81.6 kg)   SpO2 98%   BMI 25.83 kg/m    Subjective:   Patient ID: George Garcia, male    DOB: 17-Mar-1961, 63 y.o.   MRN: [id number]  HPI: George Garcia is a 63 y.o. male presenting on 11/30/2023 for BP Concerns   HPI Hypertension Patient is currently on amlodipine, and their blood pressure today is 132/85. Patient denies any lightheadedness or dizziness. Patient denies headaches, blurred vision, chest pains, shortness of breath, or weakness. Denies any side effects from medication and is content with current medication.  Patient has noticed more recently that his blood pressure has been consistently running in the 130s over 80s when he used to always run in the 120s over 70s.  He denies any symptoms from it but just wanted to come get it checked out make sure everything is okay  Relevant past medical, surgical, family and social history reviewed and updated as indicated. Interim medical history since our last visit reviewed. Allergies and medications reviewed and updated.  Review of Systems  Constitutional:  Negative for chills and fever.  Eyes:  Negative for visual disturbance.  Respiratory:  Negative for shortness of breath and wheezing.   Cardiovascular:  Negative for chest pain and leg swelling.  Musculoskeletal:  Negative for back pain and gait problem.  Skin:  Negative for rash.  Neurological:  Negative for dizziness, light-headedness and headaches.  All other systems reviewed and are negative.   Per HPI unless specifically indicated above   Allergies as of 11/30/2023   No Known Allergies      Medication List        Accurate as of November 30, 2023 11:10 AM. If you have any questions, ask your nurse or doctor.          STOP taking these medications    azithromycin 250 MG tablet Commonly known as: Zithromax Z-Pak Stopped by: Elige Radon Navada Osterhout       TAKE these medications    albuterol 108 (90 Base)  MCG/ACT inhaler Commonly known as: VENTOLIN HFA Inhale 2 puffs into the lungs every 6 (six) hours as needed for wheezing or shortness of breath.   amLODipine 5 MG tablet Commonly known as: NORVASC TAKE 1 TABLET (5 MG TOTAL) BY MOUTH DAILY.   cyclobenzaprine 10 MG tablet Commonly known as: FLEXERIL Take 1 tablet (10 mg total) by mouth 3 (three) times daily as needed for muscle spasms.   dicyclomine 20 MG tablet Commonly known as: BENTYL TAKE 1 TABLET (20 MG TOTAL) BY MOUTH EVERY 6 (SIX) HOURS. What changed: when to take this   Duexis 800-26.6 MG Tabs Generic drug: Ibuprofen-Famotidine Take by mouth as needed.   fexofenadine 180 MG tablet Commonly known as: ALLEGRA Take 180 mg by mouth daily.   fluticasone 50 MCG/ACT nasal spray Commonly known as: FLONASE Place 1 spray into both nostrils 2 (two) times daily as needed for allergies or rhinitis.   HM Salonpas Pain Relief 1.2-5.7-6.3 % Ptch Generic drug: Camphor-Menthol-Methyl Sal Apply 1 patch topically daily as needed.   HYDROcodone bit-homatropine 5-1.5 MG/5ML syrup Commonly known as: HYCODAN Take 5 mLs by mouth every 6 (six) hours as needed for cough.   hydrocortisone 25 MG suppository Commonly known as: ANUSOL-HC UNWRAP AND INSERT 1 SUPPOSITORY 3 TIMES DAILY RECTALLY   ibuprofen 800 MG tablet Commonly known as: ADVIL Take 800 mg by mouth 3 (three) times  daily.   lactulose 10 GM/15ML solution Commonly known as: CHRONULAC TAKE 30 MLS (20 G TOTAL) BY MOUTH 2 (TWO) TIMES DAILY.   lidocaine 5 % Commonly known as: LIDODERM 1 patch as needed.   loratadine 10 MG tablet Commonly known as: CLARITIN Take 1 tablet (10 mg total) by mouth daily.   methocarbamol 500 MG tablet Commonly known as: ROBAXIN Take 1 tablet (500 mg total) by mouth 4 (four) times daily.   montelukast 10 MG tablet Commonly known as: SINGULAIR TAKE 1 TABLET BY MOUTH EVERYDAY AT BEDTIME   omeprazole 40 MG capsule Commonly known as:  PRILOSEC Take 1 capsule (40 mg total) by mouth in the morning and at bedtime.   ondansetron 4 MG tablet Commonly known as: ZOFRAN Take 1 tablet (4 mg total) by mouth every 8 (eight) hours as needed for nausea or vomiting.   Pennsaid 2 % Soln Generic drug: diclofenac Sodium Apply topically daily as needed.   rosuvastatin 5 MG tablet Commonly known as: Crestor Take 1 tablet (5 mg total) by mouth daily.         Objective:   BP 132/85   Pulse 68   Ht 5\' 10"  (1.778 m)   Wt 180 lb (81.6 kg)   SpO2 98%   BMI 25.83 kg/m   Wt Readings from Last 3 Encounters:  11/30/23 180 lb (81.6 kg)  10/20/23 180 lb (81.6 kg)  08/04/23 180 lb (81.6 kg)    Physical Exam Vitals and nursing note reviewed.  Constitutional:      General: He is not in acute distress.    Appearance: He is well-developed. He is not diaphoretic.  Eyes:     General: No scleral icterus.    Conjunctiva/sclera: Conjunctivae normal.  Neck:     Thyroid: No thyromegaly.  Cardiovascular:     Rate and Rhythm: Normal rate and regular rhythm.     Heart sounds: Normal heart sounds. No murmur heard. Pulmonary:     Effort: Pulmonary effort is normal. No respiratory distress.     Breath sounds: Normal breath sounds. No wheezing.  Musculoskeletal:        General: No swelling. Normal range of motion.     Cervical back: Neck supple.  Lymphadenopathy:     Cervical: No cervical adenopathy.  Skin:    General: Skin is warm and dry.     Findings: No rash.  Neurological:     Mental Status: He is alert and oriented to person, place, and time.     Coordination: Coordination normal.  Psychiatric:        Behavior: Behavior normal.       Assessment & Plan:   Problem List Items Addressed This Visit       Cardiovascular and Mediastinum   Hypertension, essential - Primary    Continue current medicine for now, discussed diet lifestyle changes and looks like he already works out and tries to focus on his diet but he will  look more into focusing on reducing sodium and keep an eye on his blood pressures.  With it being in the 130s over 80s, I do not think we have to adjust the medicine today and discussed that with him but if it starts to go over the 140s then we can definitely discuss options. Follow up plan: Return if symptoms worsen or fail to improve.  Counseling provided for all of the vaccine components No orders of the defined types were placed in this encounter.   Arville Care, MD  [facility] 11/30/2023, 11:10 AM

## 2023-12-01 ENCOUNTER — Encounter: Payer: Self-pay | Admitting: Family Medicine

## 2023-12-01 ENCOUNTER — Other Ambulatory Visit: Payer: Self-pay | Admitting: Internal Medicine

## 2023-12-01 LAB — CMP14+EGFR
ALT: 27 IU/L (ref 0–44)
AST: 28 IU/L (ref 0–40)
Albumin: 4.5 g/dL (ref 3.9–4.9)
Alkaline Phosphatase: 69 IU/L (ref 44–121)
BUN/Creatinine Ratio: 8 — ABNORMAL LOW (ref 10–24)
BUN: 9 mg/dL (ref 8–27)
Bilirubin Total: 0.4 mg/dL (ref 0.0–1.2)
CO2: 24 mmol/L (ref 20–29)
Calcium: 9.8 mg/dL (ref 8.6–10.2)
Chloride: 103 mmol/L (ref 96–106)
Creatinine, Ser: 1.1 mg/dL (ref 0.76–1.27)
Globulin, Total: 2.2 g/dL (ref 1.5–4.5)
Glucose: 98 mg/dL (ref 70–99)
Potassium: 4.6 mmol/L (ref 3.5–5.2)
Sodium: 144 mmol/L (ref 134–144)
Total Protein: 6.7 g/dL (ref 6.0–8.5)
eGFR: 76 mL/min/{1.73_m2} (ref >59)

## 2023-12-21 ENCOUNTER — Ambulatory Visit: Admitting: Nurse Practitioner

## 2023-12-21 ENCOUNTER — Encounter: Payer: Self-pay | Admitting: Nurse Practitioner

## 2023-12-21 VITALS — BP 115/72 | HR 63 | Temp 97.6°F | Ht 70.0 in | Wt 178.0 lb

## 2023-12-21 DIAGNOSIS — R350 Frequency of micturition: Secondary | ICD-10-CM | POA: Diagnosis not present

## 2023-12-21 DIAGNOSIS — R39198 Other difficulties with micturition: Secondary | ICD-10-CM | POA: Diagnosis not present

## 2023-12-21 LAB — URINALYSIS, ROUTINE W REFLEX MICROSCOPIC
Bilirubin, UA: NEGATIVE
Glucose, UA: NEGATIVE
Ketones, UA: NEGATIVE
Leukocytes,UA: NEGATIVE
Nitrite, UA: NEGATIVE
Protein,UA: NEGATIVE
RBC, UA: NEGATIVE
Specific Gravity, UA: <1.005 — ABNORMAL LOW (ref 1.005–1.030)
Urobilinogen, Ur: 0.2 mg/dL (ref 0.2–1.0)
pH, UA: 5.5 (ref 5.0–7.5)

## 2023-12-21 LAB — MICROSCOPIC EXAMINATION
Bacteria, UA: NONE SEEN
Epithelial Cells (non renal): NONE SEEN /HPF (ref 0–10)
RBC, Urine: NONE SEEN /HPF (ref 0–2)
Renal Epithel, UA: NONE SEEN /HPF
WBC, UA: NONE SEEN /HPF (ref 0–5)
Yeast, UA: NONE SEEN

## 2023-12-21 MED ORDER — SULFAMETHOXAZOLE-TRIMETHOPRIM 800-160 MG PO TABS: 1.0000 | ORAL_TABLET | Freq: Two times a day (BID) | ORAL | 0 refills | Status: DC

## 2023-12-21 NOTE — Progress Notes (Signed)
   Subjective:    Patient ID: [name], male    DOB: 04/06/1961, 63 y.o.   MRN: [id number]   Chief Complaint: Urinary pressure   Patient co suprapubic pain and frequent urination. Started 4 days ago and has continued. He has been taking AZO OTC which has helped with symptoms. His says he gets up several times at night to void.     Patient Active Problem List   Diagnosis Date Noted   Hypertension, essential 11/30/2023   Hyperlipidemia 06/07/2023   DDD (degenerative disc disease), lumbar 07/04/2019   Chronic midline low back pain with right-sided sciatica 07/04/2019   Disease related peripheral neuropathy 04/08/2019   GERD (gastroesophageal reflux disease) 12/05/2018   Impingement syndrome of left shoulder 09/05/2018   IBS (irritable bowel syndrome) 09/05/2018   Chronic sinusitis 09/05/2018       Review of Systems  Constitutional:  Negative for diaphoresis.  Eyes:  Negative for pain.  Respiratory:  Negative for shortness of breath.   Cardiovascular:  Negative for chest pain, palpitations and leg swelling.  Gastrointestinal:  Negative for abdominal pain.  Endocrine: Negative for polydipsia.  Skin:  Negative for rash.  Neurological:  Negative for dizziness, weakness and headaches.  Hematological:  Does not bruise/bleed easily.  All other systems reviewed and are negative.      Objective:   Physical Exam Constitutional:      Appearance: Normal appearance.  Cardiovascular:     Rate and Rhythm: Normal rate and regular rhythm.     Heart sounds: Normal heart sounds.  Pulmonary:     Breath sounds: Normal breath sounds.  Abdominal:     Tenderness: There is no abdominal tenderness. There is no right CVA tenderness or left CVA tenderness.  Neurological:     Mental Status: He is alert.    BP 115/72   Pulse 63   Temp 97.6 F (36.4 C) (Temporal)   Ht 5\' 10"  (1.778 m)   Wt 178 lb (80.7 kg)   SpO2 97%   BMI 25.54 kg/m         Assessment & Plan:   [name] in  today with chief complaint of Urinary pressure   1. Difficulty in urination (Primary) - Urinalysis, Routine w reflex microscopic - Urine Culture  2. Urinary frequency Will empirically treat with bactrim Continue AZO PTC Force fluids- cranberry juice If not improving will do urology referral - sulfamethoxazole-trimethoprim (BACTRIM DS) 800-160 MG tablet; Take 1 tablet by mouth 2 (two) times daily.  Dispense: 20 tablet; Refill: 0    The above assessment and management plan was discussed with the patient. The patient verbalized understanding of and has agreed to the management plan. Patient is aware to call the clinic if symptoms persist or worsen. Patient is aware when to return to the clinic for a follow-up visit. Patient educated on when it is appropriate to go to the emergency department.   Mary-Margaret Daphine Deutscher, FNP

## 2023-12-22 DIAGNOSIS — M5432 Sciatica, left side: Secondary | ICD-10-CM | POA: Diagnosis not present

## 2023-12-22 DIAGNOSIS — M9903 Segmental and somatic dysfunction of lumbar region: Secondary | ICD-10-CM | POA: Diagnosis not present

## 2023-12-22 DIAGNOSIS — M5451 Vertebrogenic low back pain: Secondary | ICD-10-CM | POA: Diagnosis not present

## 2023-12-22 DIAGNOSIS — M5431 Sciatica, right side: Secondary | ICD-10-CM | POA: Diagnosis not present

## 2023-12-23 LAB — URINE CULTURE: Organism ID, Bacteria: NO GROWTH

## 2023-12-25 ENCOUNTER — Other Ambulatory Visit: Payer: Self-pay | Admitting: Family Medicine

## 2024-01-19 ENCOUNTER — Telehealth: Payer: Self-pay | Admitting: Family Medicine

## 2024-01-19 ENCOUNTER — Other Ambulatory Visit: Payer: Self-pay

## 2024-01-19 DIAGNOSIS — R39198 Other difficulties with micturition: Secondary | ICD-10-CM

## 2024-01-19 DIAGNOSIS — M5432 Sciatica, left side: Secondary | ICD-10-CM | POA: Diagnosis not present

## 2024-01-19 DIAGNOSIS — M5451 Vertebrogenic low back pain: Secondary | ICD-10-CM | POA: Diagnosis not present

## 2024-01-19 DIAGNOSIS — M9903 Segmental and somatic dysfunction of lumbar region: Secondary | ICD-10-CM | POA: Diagnosis not present

## 2024-01-19 DIAGNOSIS — M5431 Sciatica, right side: Secondary | ICD-10-CM | POA: Diagnosis not present

## 2024-01-19 NOTE — Telephone Encounter (Signed)
 placed

## 2024-01-19 NOTE — Telephone Encounter (Signed)
 Yes go ahead and place referral to urology in [place], diagnosis difficulty urinating.

## 2024-01-19 NOTE — Telephone Encounter (Signed)
 Copied from CRM [id number]. Topic: Referral - Request for Referral >> Jan 19, 2024  9:27 AM Alpha Arts wrote: Did the patient discuss referral with their provider in the last year? Yes (If No - schedule appointment) (If Yes - send message)  Appointment offered? No  Type of order/referral and detailed reason for visit: urologist   Preference of office, provider, location: [place] and [place] area. Also has no problem going to [place]  If referral order, have you been seen by this specialty before? No (If Yes, this issue or another issue? When? Where?  Can we respond through MyChart? Yes

## 2024-01-20 NOTE — Progress Notes (Signed)
 01/23/2024 7:27 PM   Woodward Head 09/03/1961 [id number]  Referring provider: Dettinger, Lucio Sabin, MD [address] [place],  [place] [address]  No chief complaint on file.   HPI:    PMH: Past Medical History:  Diagnosis Date   Allergy    Asthma    GERD (gastroesophageal reflux disease)    Hypertension    IBS (irritable bowel syndrome)    [facility] spotted fever    2020 and 2021   Wears glasses     Surgical History: Past Surgical History:  Procedure Laterality Date   colonscopy  2019   HEMORRHOID SURGERY N/A 09/01/2021   Procedure: SINGLE COLUMN HEMORRHOIDECTOMY;  Surgeon: Joyce Nixon, MD;  Location: [facility] [place];  Service: General;  Laterality: N/A;   SHOULDER SURGERY Right 2007   rotator cuff repair    Home Medications:  Allergies as of 01/23/2024   No Known Allergies      Medication List        Accurate as of Jan 20, 2024  7:27 PM. If you have any questions, ask your nurse or doctor.          albuterol  108 (90 Base) MCG/ACT inhaler Commonly known as: VENTOLIN  HFA Inhale 2 puffs into the lungs every 6 (six) hours as needed for wheezing or shortness of breath.   amLODipine  5 MG tablet Commonly known as: NORVASC  TAKE 1 TABLET (5 MG TOTAL) BY MOUTH DAILY.   cyclobenzaprine  10 MG tablet Commonly known as: FLEXERIL  Take 1 tablet (10 mg total) by mouth 3 (three) times daily as needed for muscle spasms.   dicyclomine  20 MG tablet Commonly known as: BENTYL  TAKE 1 TABLET (20 MG TOTAL) BY MOUTH EVERY 6 (SIX) HOURS. What changed: when to take this   Duexis 800-26.6 MG Tabs Generic drug: Ibuprofen -Famotidine Take by mouth as needed.   fexofenadine  180 MG tablet Commonly known as: ALLEGRA Take 180 mg by mouth daily.   fluticasone  50 MCG/ACT nasal spray Commonly known as: FLONASE  PLACE 1 SPRAY INTO BOTH NOSTRILS 2 (TWO) TIMES DAILY AS NEEDED FOR ALLERGIES OR RHINITIS.   HM Salonpas  Pain Relief  1.2-5.7-6.3 % Ptch Generic drug:  Camphor-Menthol-Methyl Sal Apply 1 patch topically daily as needed.   hydrocortisone 25 MG suppository Commonly known as: ANUSOL-HC UNWRAP AND INSERT 1 SUPPOSITORY 3 TIMES DAILY RECTALLY   ibuprofen  800 MG tablet Commonly known as: ADVIL  Take 800 mg by mouth 3 (three) times daily.   lactulose  10 GM/15ML solution Commonly known as: CHRONULAC  TAKE 30 MLS (20 G TOTAL) BY MOUTH 2 (TWO) TIMES DAILY.   lidocaine  5 % Commonly known as: LIDODERM  1 patch as needed.   loratadine  10 MG tablet Commonly known as: CLARITIN  Take 1 tablet (10 mg total) by mouth daily.   methocarbamol  500 MG tablet Commonly known as: ROBAXIN  Take 1 tablet (500 mg total) by mouth 4 (four) times daily.   montelukast  10 MG tablet Commonly known as: SINGULAIR  TAKE 1 TABLET BY MOUTH EVERYDAY AT BEDTIME   omeprazole  40 MG capsule Commonly known as: PRILOSEC Take 1 capsule (40 mg total) by mouth in the morning and at bedtime.   ondansetron  4 MG tablet Commonly known as: ZOFRAN  Take 1 tablet (4 mg total) by mouth every 8 (eight) hours as needed for nausea or vomiting.   Pennsaid 2 % Soln Generic drug: diclofenac Sodium Apply topically daily as needed.   rosuvastatin  5 MG tablet Commonly known as: Crestor  Take 1 tablet (5 mg total) by mouth daily.  sulfamethoxazole -trimethoprim  800-160 MG tablet Commonly known as: Bactrim  DS Take 1 tablet by mouth 2 (two) times daily.        Allergies: No Known Allergies  Family History: Family History  Problem Relation Age of Onset   Stroke Mother    Diabetes Mother    Stroke Father    Kidney disease Sister    Kidney disease Brother    Fibroids Daughter    Healthy Daughter    Healthy Daughter    Healthy Son    Colon cancer Neg Hx    Cancer - Colon Neg Hx    Rectal cancer Neg Hx    Stomach cancer Neg Hx    Esophageal cancer Neg Hx    Colon polyps Neg Hx     Social History:  reports that he has never smoked. He has never used smokeless tobacco. He  reports that he does not drink alcohol and does not use drugs.  ROS: All other review of systems were reviewed and are negative except what is noted above in HPI  Physical Exam: There were no vitals taken for this visit.  Constitutional:  Alert and oriented, No acute distress. HEENT: [place] AT, moist mucus membranes.  Trachea midline, no masses. Cardiovascular: No clubbing, cyanosis, or edema. Respiratory: Normal respiratory effort, no increased work of breathing. GI: No inguinal hernias GU: Normal phallus. No masses/lesions on penis, testis, scrotum. Prostate ***g smooth no nodules no induration.  Lymph: No cervical or inguinal lymphadenopathy. Skin: No rashes, bruises or suspicious lesions. Neurologic: Grossly intact, no focal deficits, moving all 4 extremities. Psychiatric: Normal mood and affect.  Laboratory Data: Lab Results  Component Value Date   WBC 3.4 10/20/2023   HGB 14.7 10/20/2023   HCT 45.1 10/20/2023   MCV 90 10/20/2023   PLT 206 10/20/2023    Lab Results  Component Value Date   CREATININE 1.10 11/30/2023    No results found for: "PSA"  No results found for: "TESTOSTERONE"  No results found for: "HGBA1C"  Urinalysis    Component Value Date/Time   APPEARANCEUR Clear 12/21/2023 0910   GLUCOSEU Negative 12/21/2023 0910   BILIRUBINUR Negative 12/21/2023 0910   PROTEINUR Negative 12/21/2023 0910   NITRITE Negative 12/21/2023 0910   LEUKOCYTESUR Negative 12/21/2023 0910    Lab Results  Component Value Date   LABMICR See below: 12/21/2023   WBCUA None seen 12/21/2023   LABEPIT None seen 12/21/2023   BACTERIA None seen 12/21/2023    Pertinent Imaging: *** No results found for this or any previous visit.  No results found for this or any previous visit.  No results found for this or any previous visit.  No results found for this or any previous visit.  No results found for this or any previous visit.  No results found for this or any previous  visit.  No results found for this or any previous visit.  No results found for this or any previous visit.   Assessment & Plan:    There are no diagnoses linked to this encounter.  No follow-ups on file.  Roque Collar, MD  [facility] [place]

## 2024-01-23 ENCOUNTER — Encounter: Payer: Self-pay | Admitting: Urology

## 2024-01-23 ENCOUNTER — Ambulatory Visit: Admitting: Urology

## 2024-01-23 VITALS — BP 128/84 | HR 73

## 2024-01-23 DIAGNOSIS — R300 Dysuria: Secondary | ICD-10-CM

## 2024-01-23 DIAGNOSIS — R39198 Other difficulties with micturition: Secondary | ICD-10-CM | POA: Diagnosis not present

## 2024-01-23 MED ORDER — SULFAMETHOXAZOLE-TRIMETHOPRIM 800-160 MG PO TABS: 1.0000 | ORAL_TABLET | Freq: Two times a day (BID) | ORAL | 0 refills | Status: DC

## 2024-02-14 DIAGNOSIS — M5432 Sciatica, left side: Secondary | ICD-10-CM | POA: Diagnosis not present

## 2024-02-14 DIAGNOSIS — M5431 Sciatica, right side: Secondary | ICD-10-CM | POA: Diagnosis not present

## 2024-02-14 DIAGNOSIS — M5451 Vertebrogenic low back pain: Secondary | ICD-10-CM | POA: Diagnosis not present

## 2024-02-14 DIAGNOSIS — M9903 Segmental and somatic dysfunction of lumbar region: Secondary | ICD-10-CM | POA: Diagnosis not present

## 2024-03-05 DIAGNOSIS — M5431 Sciatica, right side: Secondary | ICD-10-CM | POA: Diagnosis not present

## 2024-03-05 DIAGNOSIS — M9903 Segmental and somatic dysfunction of lumbar region: Secondary | ICD-10-CM | POA: Diagnosis not present

## 2024-03-05 DIAGNOSIS — M5432 Sciatica, left side: Secondary | ICD-10-CM | POA: Diagnosis not present

## 2024-03-05 DIAGNOSIS — M5451 Vertebrogenic low back pain: Secondary | ICD-10-CM | POA: Diagnosis not present

## 2024-03-06 ENCOUNTER — Telehealth: Payer: Self-pay

## 2024-03-06 NOTE — Telephone Encounter (Signed)
 Pt made aware that he needs an appt in the office first before referral can be placed. He did not want to wait until his July 31st appt. Appt scheduled 7/2 at 9:25am

## 2024-03-06 NOTE — Telephone Encounter (Signed)
 Copied from CRM [id number]. Topic: Referral - Status >> Mar 06, 2024  8:10 AM Emylou G wrote: Reason for CRM: Patient called.Aaron Aas looking for memory referral - wants to see specialist.. in [place] area if possible.

## 2024-03-19 ENCOUNTER — Ambulatory Visit: Admitting: Urology

## 2024-03-20 ENCOUNTER — Encounter: Payer: Self-pay | Admitting: Family Medicine

## 2024-03-20 ENCOUNTER — Ambulatory Visit: Admitting: Family Medicine

## 2024-03-20 VITALS — BP 129/81 | HR 64 | Ht 70.0 in | Wt 176.0 lb

## 2024-03-20 DIAGNOSIS — R413 Other amnesia: Secondary | ICD-10-CM | POA: Diagnosis not present

## 2024-03-20 NOTE — Progress Notes (Signed)
 BP 129/81   Pulse 64   Ht 5' 10 (1.778 m)   Wt 176 lb (79.8 kg)   SpO2 97%   BMI 25.25 kg/m    Subjective:   Patient ID: George Garcia, male    DOB: 09/04/1961, 63 y.o.   MRN: [id number]  HPI: George Garcia is a 63 y.o. male presenting on 03/20/2024 for Memory Changes (Getting worse)   HPI Memory changes Patient comes in complaining of some different issues including memory changes, he says has been gradual and on and off over the past 6 years but he is definitely noticed it more recently, he says he will notice it and things like recognition of things like recognizing his own name tag, he says it will take sometimes with a little bit to recognize it.  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  Normally he has 50 words per minute and he can barely do 10 words per minute when he is having these spells as he calls them.  He says it will come for 3 or 4 days or maybe even a half a day and then will go away for a while and he will be back to normal and then will come again.  He denies any visual deficits or troubles with sight or hearing.  He denies any focal numbness or weakness when they happen.  He denies any lightheadedness or dizziness or chest pains.  He says is not as much that he forgets things, it is more that he forgets how to do things as efficiently as it used to.  Mini-Mental status exam was 28 out of 30 today  Relevant past medical, surgical, family and social history reviewed and updated as indicated. Interim medical history since our last visit reviewed. Allergies and medications reviewed and updated.  Review of Systems  Constitutional:  Negative for chills and fever.  Eyes:  Negative for visual disturbance.  Respiratory:  Negative for shortness of breath and wheezing.   Cardiovascular:  Negative for chest pain and leg swelling.  Skin:  Negative for rash.  Neurological:  Negative  for dizziness and light-headedness.  Psychiatric/Behavioral:  Positive for decreased concentration. Negative for confusion, self-injury, sleep disturbance and suicidal ideas.   All other systems reviewed and are negative.   Per HPI unless specifically indicated above   Allergies as of 03/20/2024   No Known Allergies      Medication List        Accurate as of March 20, 2024 10:08 AM. If you have any questions, ask your nurse or doctor.          STOP taking these medications    sulfamethoxazole -trimethoprim  800-160 MG tablet Commonly known as: BACTRIM  DS Stopped by: Fonda LABOR Carney Saxton       TAKE these medications    albuterol  108 (90 Base) MCG/ACT inhaler Commonly known as: VENTOLIN  HFA Inhale 2 puffs into the lungs every 6 (six) hours as needed for wheezing or shortness of breath.   amLODipine  5 MG tablet Commonly known as: NORVASC  TAKE 1 TABLET (5 MG TOTAL) BY MOUTH DAILY.   cyclobenzaprine  10 MG tablet Commonly known as: FLEXERIL  Take 1 tablet (10 mg total) by mouth 3 (three) times daily as needed for muscle spasms.   dicyclomine  20 MG tablet Commonly known as: BENTYL  TAKE 1 TABLET (20 MG TOTAL) BY MOUTH EVERY 6 (SIX) HOURS. What changed:  when to take this   Duexis 800-26.6 MG Tabs Generic drug: Ibuprofen -Famotidine Take by mouth as needed.   fexofenadine  180 MG tablet Commonly known as: ALLEGRA Take 180 mg by mouth daily.   fluticasone  50 MCG/ACT nasal spray Commonly known as: FLONASE  PLACE 1 SPRAY INTO BOTH NOSTRILS 2 (TWO) TIMES DAILY AS NEEDED FOR ALLERGIES OR RHINITIS.   HM Salonpas  Pain Relief  1.2-5.7-6.3 % Ptch Generic drug: Camphor-Menthol-Methyl Sal Apply 1 patch topically daily as needed.   hydrocortisone 25 MG suppository Commonly known as: ANUSOL-HC UNWRAP AND INSERT 1 SUPPOSITORY 3 TIMES DAILY RECTALLY   ibuprofen  800 MG tablet Commonly known as: ADVIL  Take 800 mg by mouth 3 (three) times daily.   lactulose  10 GM/15ML  solution Commonly known as: CHRONULAC  TAKE 30 MLS (20 G TOTAL) BY MOUTH 2 (TWO) TIMES DAILY.   lidocaine  5 % Commonly known as: LIDODERM  1 patch as needed.   loratadine  10 MG tablet Commonly known as: CLARITIN  Take 1 tablet (10 mg total) by mouth daily.   methocarbamol  500 MG tablet Commonly known as: ROBAXIN  Take 1 tablet (500 mg total) by mouth 4 (four) times daily.   montelukast  10 MG tablet Commonly known as: SINGULAIR  TAKE 1 TABLET BY MOUTH EVERYDAY AT BEDTIME   omeprazole  40 MG capsule Commonly known as: PRILOSEC Take 1 capsule (40 mg total) by mouth in the morning and at bedtime.   ondansetron  4 MG tablet Commonly known as: ZOFRAN  Take 1 tablet (4 mg total) by mouth every 8 (eight) hours as needed for nausea or vomiting.   Pennsaid 2 % Soln Generic drug: diclofenac Sodium Apply topically daily as needed.   rosuvastatin  5 MG tablet Commonly known as: Crestor  Take 1 tablet (5 mg total) by mouth daily.         Objective:   BP 129/81   Pulse 64   Ht 5' 10 (1.778 m)   Wt 176 lb (79.8 kg)   SpO2 97%   BMI 25.25 kg/m   Wt Readings from Last 3 Encounters:  03/20/24 176 lb (79.8 kg)  12/21/23 178 lb (80.7 kg)  11/30/23 180 lb (81.6 kg)    Physical Exam Vitals and nursing note reviewed.  Constitutional:      General: He is not in acute distress.    Appearance: He is well-developed. He is not diaphoretic.  Eyes:     General: No scleral icterus.    Extraocular Movements: Extraocular movements intact.     Conjunctiva/sclera: Conjunctivae normal.     Pupils: Pupils are equal, round, and reactive to light.  Neck:     Thyroid : No thyromegaly.  Cardiovascular:     Rate and Rhythm: Normal rate and regular rhythm.     Heart sounds: Normal heart sounds. No murmur heard. Pulmonary:     Effort: Pulmonary effort is normal. No respiratory distress.     Breath sounds: Normal breath sounds. No wheezing.  Musculoskeletal:        General: Normal range of motion.      Cervical back: Neck supple.  Lymphadenopathy:     Cervical: No cervical adenopathy.  Skin:    General: Skin is warm and dry.     Findings: No rash.  Neurological:     General: No focal deficit present.     Mental Status: He is alert and oriented to person, place, and time. Mental status is at baseline.     Cranial Nerves: No cranial nerve deficit.     Sensory: No sensory deficit.  Motor: No weakness.     Coordination: Coordination normal.     Gait: Gait normal.  Psychiatric:        Mood and Affect: Mood normal.        Behavior: Behavior normal.       Assessment & Plan:   Problem List Items Addressed This Visit   None Visit Diagnoses       Memory changes    -  Primary   Relevant Orders   Ambulatory referral to Neurology       Concern for higher processing issues, could be related to memory but it seems more higher processing.  Will do referral to neurology asap Follow up plan: Return if symptoms worsen or fail to improve.  Counseling provided for all of the vaccine components Orders Placed This Encounter  Procedures   Ambulatory referral to Neurology    Fonda Levins, MD [facility] 03/20/2024, 10:08 AM

## 2024-03-25 ENCOUNTER — Encounter: Payer: Self-pay | Admitting: Neurology

## 2024-03-25 ENCOUNTER — Ambulatory Visit: Admitting: Neurology

## 2024-03-25 VITALS — BP 110/68 | HR 57 | Ht 70.0 in | Wt 179.0 lb

## 2024-03-25 DIAGNOSIS — R4789 Other speech disturbances: Secondary | ICD-10-CM

## 2024-03-25 DIAGNOSIS — R351 Nocturia: Secondary | ICD-10-CM

## 2024-03-25 DIAGNOSIS — R0683 Snoring: Secondary | ICD-10-CM

## 2024-03-25 DIAGNOSIS — R410 Disorientation, unspecified: Secondary | ICD-10-CM

## 2024-03-25 DIAGNOSIS — Z9189 Other specified personal risk factors, not elsewhere classified: Secondary | ICD-10-CM

## 2024-03-25 DIAGNOSIS — R413 Other amnesia: Secondary | ICD-10-CM

## 2024-03-25 DIAGNOSIS — F810 Specific reading disorder: Secondary | ICD-10-CM

## 2024-03-25 NOTE — Progress Notes (Signed)
 Subjective:    Patient ID: George Garcia is a 63 y.o. male.  HPI    True Mar, MD, PhD [facility] [address] [address] [place], [place] [address]  Dear Dr. Maryanne,  I saw your patient, George Garcia, upon your kind request in my neurologic clinic today for initial consultation of his memory loss.  The patient is unaccompanied today.  As you know, George Garcia is a 64 year old male with an underlying medical history of allergies, asthma, irritable bowel syndrome, history of [facility] spotted fever, borderline overweight state, reflux disease, and hypertension, who reports a several year history of difficulty with his memory. He reports intermittent difficulty with written words as well as getting words out.  He does not have any slurring of speech and never had any strokelike symptoms such as one-sided weakness or numbness or tingling or droopy face.  He does not lose consciousness.  He has never had any physical accompaniments.  Symptoms have been ongoing for years and he feels that they have been progressive as and more frequent.  He has an episode 1 or 2 times a month and it can last for hours to up to 2 days.  He has no family history of dementia.  He is 1 of 9 siblings altogether, 2 of his 8 siblings have passed away.  His parents did not have any memory loss or similar issues.  He is a non-smoker and does not drink any alcohol, he has quite a bit of caffeine in the form of energy drinks, 1 or 2/day and 8 to 12 ounces of coffee per day on average, occasional tea or soda.  He has a history of snoring, he had a sleep study over 15 years ago and was not diagnosed with sleep apnea at the time.  He does have multiple medications on his list but does not take them all daily, he takes Flexeril  as needed only, he does take amlodipine  daily, omeprazole  daily, Allegra and Flonase  as well as lactulose  for severe constipation.  He drinks about 3-4 bottles of water per day  and Gatorade or electrolyte water also daily depending on how active he is outside.  He has nocturia about 2-3 times per average night and denies recurrent morning headaches.   He reports that he has never had an ER visit because of these episodes, his previous primary care when he was still residing in [place]  [place] attributed his symptoms to medication effect or stress he reports.  I reviewed your office note from 03/20/2024.  He did not have any blood work or neuroimaging at the time.  He reported a 6-year history of problems with name recognition, trouble typing, and word recall issues that may last for days at a time but seem to come in spells.  His MMSE was 28 out of 30 at the time.  He has not had any neuroimaging in the past as far as I can see in his electronic chart.  Blood work from 10/20/2023 was reviewed in his electronic chart.  PSA was normal at the time.  CBC showed benign findings, CMP showed benign findings with the exception of borderline ALT at 46, lipid panel benign with LDL 88, total cholesterol 844, triglycerides 137, TSH normal at 0.818.  His Past Medical History Is Significant For: Past Medical History:  Diagnosis Date   Allergy    Asthma    GERD (gastroesophageal reflux disease)    Hypertension    IBS (irritable bowel  syndrome)    [facility] spotted fever    2020 and 2021   Wears glasses     His Past Surgical History Is Significant For: Past Surgical History:  Procedure Laterality Date   colonscopy  2019   HEMORRHOID SURGERY N/A 09/01/2021   Procedure: SINGLE COLUMN HEMORRHOIDECTOMY;  Surgeon: Debby Hila, MD;  Location: [facility];  Service: General;  Laterality: N/A;   SHOULDER SURGERY Right 2007   rotator cuff repair    His Family History Is Significant For: Family History  Problem Relation Age of Onset   Stroke Mother    Diabetes Mother    Stroke Father    Kidney disease Sister    Kidney disease Brother    Fibroids Daughter     Healthy Daughter    Healthy Daughter    Healthy Son    Colon cancer Neg Hx    Cancer - Colon Neg Hx    Rectal cancer Neg Hx    Stomach cancer Neg Hx    Esophageal cancer Neg Hx    Colon polyps Neg Hx     His Social History Is Significant For: Social History   Socioeconomic History   Marital status: Married    Spouse name: Not on file   Number of children: 3   Years of education: Not on file   Highest education level: Bachelor's degree (e.g., BA, AB, BS)  Occupational History   Occupation: retired  Tobacco Use   Smoking status: Never   Smokeless tobacco: Never  Vaping Use   Vaping status: Never Used  Substance and Sexual Activity   Alcohol use: Never   Drug use: Never   Sexual activity: Yes    Comment: married since 1999, 3 child 42, 65, 64  Other Topics Concern   Not on file  Social History Narrative   Right handed   Lives at home with wife   Caffeine: 1.5 cups/day, some days 4-12 oz of energy drink   Social Drivers of Health   Financial Resource Strain: Low Risk  (03/16/2024)   Overall Financial Resource Strain (CARDIA)    Difficulty of Paying Living Expenses: Not hard at all  Food Insecurity: No Food Insecurity (03/16/2024)   Hunger Vital Sign    Worried About Running Out of Food in the Last Year: Never true    Ran Out of Food in the Last Year: Never true  Transportation Needs: No Transportation Needs (03/16/2024)   PRAPARE - Administrator, Civil Service (Medical): No    Lack of Transportation (Non-Medical): No  Physical Activity: Sufficiently Active (03/16/2024)   Exercise Vital Sign    Days of Exercise per Week: 6 days    Minutes of Exercise per Session: 60 min  Stress: No Stress Concern Present (03/16/2024)   [employer] of Occupational Health - Occupational Stress Questionnaire    Feeling of Stress: Not at all  Social Connections: Socially Integrated (03/16/2024)   Social Connection and Isolation Panel    Frequency of Communication with  Friends and Family: Once a week    Frequency of Social Gatherings with Friends and Family: Twice a week    Attends Religious Services: More than 4 times per year    Active Member of [employer] or Organizations: Yes    Attends Engineer, structural: More than 4 times per year    Marital Status: Married    His Allergies Are:  No Known Allergies:   His Current Medications Are:  Outpatient  Encounter Medications as of 03/25/2024  Medication Sig   albuterol  (VENTOLIN  HFA) 108 (90 Base) MCG/ACT inhaler Inhale 2 puffs into the lungs every 6 (six) hours as needed for wheezing or shortness of breath.   amLODipine  (NORVASC ) 5 MG tablet TAKE 1 TABLET (5 MG TOTAL) BY MOUTH DAILY.   Camphor-Menthol-Methyl Sal (HM SALONPAS  PAIN RELIEF ) 1.2-5.7-6.3 % PTCH Apply 1 patch topically daily as needed.   cyclobenzaprine  (FLEXERIL ) 10 MG tablet Take 1 tablet (10 mg total) by mouth 3 (three) times daily as needed for muscle spasms.   Diclofenac Sodium (PENNSAID) 2 % SOLN Apply topically daily as needed.   dicyclomine  (BENTYL ) 20 MG tablet TAKE 1 TABLET (20 MG TOTAL) BY MOUTH EVERY 6 (SIX) HOURS.   fluticasone  (FLONASE ) 50 MCG/ACT nasal spray PLACE 1 SPRAY INTO BOTH NOSTRILS 2 (TWO) TIMES DAILY AS NEEDED FOR ALLERGIES OR RHINITIS.   hydrocortisone (ANUSOL-HC) 25 MG suppository UNWRAP AND INSERT 1 SUPPOSITORY 3 TIMES DAILY RECTALLY   ibuprofen  (ADVIL ) 800 MG tablet Take 800 mg by mouth 3 (three) times daily.   Ibuprofen -Famotidine (DUEXIS) 800-26.6 MG TABS Take by mouth as needed.   lactulose  (CHRONULAC ) 10 GM/15ML solution TAKE 30 MLS (20 G TOTAL) BY MOUTH 2 (TWO) TIMES DAILY.   lidocaine  (LIDODERM ) 5 % 1 patch as needed.   loratadine  (CLARITIN ) 10 MG tablet Take 1 tablet (10 mg total) by mouth daily.   omeprazole  (PRILOSEC) 40 MG capsule Take 1 capsule (40 mg total) by mouth in the morning and at bedtime.   ondansetron  (ZOFRAN ) 4 MG tablet Take 1 tablet (4 mg total) by mouth every 8 (eight) hours as needed for  nausea or vomiting.   rosuvastatin  (CRESTOR ) 5 MG tablet Take 1 tablet (5 mg total) by mouth daily.   fexofenadine  (ALLEGRA) 180 MG tablet Take 180 mg by mouth daily. (Patient not taking: Reported on 03/25/2024)   methocarbamol  (ROBAXIN ) 500 MG tablet Take 1 tablet (500 mg total) by mouth 4 (four) times daily. (Patient not taking: Reported on 03/25/2024)   montelukast  (SINGULAIR ) 10 MG tablet TAKE 1 TABLET BY MOUTH EVERYDAY AT BEDTIME (Patient not taking: Reported on 03/25/2024)   No facility-administered encounter medications on file as of 03/25/2024.  :   Review of Systems:  Out of a complete 14 point review of systems, all are reviewed and negative with the exception of these symptoms as listed below:          Review of Systems  Neurological:        Patient is here with wife for memory concerns which have been ongoing for years and is not getting better. Both he and family have noticed it. It is one of the reasons why he retired early. He denies any falls in last 3 months nor any trouble with ADLs. He is unsure if family history of memory trouble. MOCA 22/30    Objective:  Neurological Exam  Physical Exam Physical Examination:   Vitals:   03/25/24 1330  BP: 110/68  Pulse: (!) 57    General Examination: The patient is a very pleasant 63 y.o. male in no acute distress. He appears well-developed and well-nourished and well groomed.   HEENT: Normocephalic, atraumatic, pupils are equal, round and reactive to light, extraocular tracking is good without limitation to gaze excursion or nystagmus noted.  Corrective eyeglasses in place.  Hearing is grossly intact to tuning fork. Face is symmetric with normal facial animation and normal facial sensation to light touch, temperature and vibration sense. Speech is  clear with no dysarthria noted. There is no hypophonia. There is no lip, neck/head, jaw or voice tremor. Neck is supple with full range of passive and active motion. There are no  carotid bruits on auscultation. Oropharynx exam reveals: mild mouth dryness, adequate dental hygiene and moderate airway crowding, due to small airway entry, Mallampati class IV, tonsils absent, tip of uvula not fully visualized.  Tongue protrudes centrally and palate elevates symmetrically.  Chest: Clear to auscultation without wheezing, rhonchi or crackles noted.  Heart: S1+S2+0, regular and normal without murmurs, rubs or gallops noted.   Abdomen: Soft, non-tender and non-distended.  Extremities: There is no pitting edema in the distal lower extremities bilaterally.   Skin: Warm and dry without trophic changes noted.   Musculoskeletal: exam reveals no obvious joint deformities.   Neurologically:  Mental status: The patient is awake, alert and oriented in all 4 spheres. His immediate and remote memory, attention, language skills and fund of knowledge are appropriate.  His history is minimally supplemented by his wife.  There is no evidence of aphasia, agnosia, apraxia or anomia. Speech is clear with normal prosody and enunciation. Thought process is linear. Mood is normal and affect is normal.      03/25/2024    1:23 PM  Montreal Cognitive Assessment   Visuospatial/ Executive (0/5) 3  Naming (0/3) 2  Attention: Read list of digits (0/2) 2  Attention: Read list of letters (0/1) 1  Attention: Serial 7 subtraction starting at 100 (0/3) 2  Language: Repeat phrase (0/2) 2  Language : Fluency (0/1) 0  Abstraction (0/2) 2  Delayed Recall (0/5) 2  Orientation (0/6) 6  Total 22  Adjusted Score (based on education) 22    Cranial nerves II - XII are as described above under HEENT exam.  Motor exam: Normal bulk, strength and tone is noted. There is no obvious action or resting tremor.  No drift or rebound, no postural or intention tremor. Reflexes 1+ throughout, including ankles, toes are downgoing bilaterally. Fine motor skills and coordination: grossly intact in the upper and lower  extremities.  Cerebellar testing: No dysmetria or intention tremor. There is no truncal or gait ataxia.  Normal finger-to-nose, normal heel-to-shin bilaterally. Sensory exam: intact to light touch, temperature and vibration sense in the upper and lower extremities.  Gait, station and balance: He stands easily. No veering to one side is noted. No leaning to one side is noted. Posture is age-appropriate and stance is narrow based. Gait shows normal stride length and normal pace. No problems turning are noted.   Assessment and plan:  In summary, George Garcia is a very pleasant 63 y.o.-year old male with an underlying medical history of allergies, asthma, irritable bowel syndrome, history of [facility] spotted fever, borderline overweight state, reflux disease, and hypertension, who presents for evaluation of his cognitive complaints of several years duration, consisting primarily of difficulty reading a written word or getting words out correctly.  Symptoms are episodic.  I had a long chat with the patient and his wife today.  We talked about the importance of maintaining a healthy lifestyle.  He is advised to cut back on his caffeine intake by eliminating completely his energy drinks.   Below is a summary of my recommendation and are talking points based on today's visit, his chart review, and his neurological exam as well as memory test results.  He has a mildly abnormal MoCA score today.  He had a fairly good MMSE just a week ago  in your office.  History and examination are not telltale for dementia or mild cognitive impairment.   He was given instructions verbally during the visit and in his MyChart after visit summary in writing.   << Blood work (which we will do today).  We will do a brain scan, called MRI and call you with the test results. We will have to schedule you for this on a separate date. This test requires authorization from your insurance, and we will take care of the insurance  process. We will request a formal cognitive test called neuropsychological evaluation which is done by a licensed neuropsychologist. We will make a referral in that regard.  We may consider medication for memory loss down the road, depending on the results.  We will do an EEG (brainwave test), which we will schedule. We will call you with the results. I recommend we proceed with a sleep study to rule out obstructive sleep apnea.  If you have obstructive sleep apnea I will likely recommend treatment with a CPAP or AutoPap machine. >>  This was an extended visit of over 60 minutes with copious record review involved, considerable counseling and coordination of care and addressing multiple issues.    Thank you very much for allowing me to participate in the care of this nice patient. If I can be of any further assistance to you please do not hesitate to call me at [phone].  Sincerely,   True Mar, MD, PhD

## 2024-03-25 NOTE — Patient Instructions (Addendum)
 It was nice to meet you both today.  You have complaints of memory loss: memory loss or changes in cognitive function can have many reasons and does not always mean you have dementia.  There are several conditions and situations that can contribute to subjective or objective memory loss.  These factors include: depression, stress, sleep deprivation or poor sleep from insomnia or sleep apnea, dehydration, fluctuation in blood sugar values, thyroid  or electrolyte dysfunction, medication effects from sedating medications or narcotic pain medication for example and certain vitamin deficiencies such as vitamin B12 deficiency, and anemia. Dementia can be caused by stroke, brain atherosclerosis or brain vascular disease due to vascular risk factors (smoking, high blood pressure, high cholesterol, obesity and uncontrolled diabetes), certain degenerative brain disorders (including Parkinson's disease and Multiple sclerosis) and by Alzheimer's disease or other, more rare and sometimes hereditary causes.   Here is what I would recommend:   Blood work (which we will do today).  We will do a brain scan, called MRI and call you with the test results. We will have to schedule you for this on a separate date. This test requires authorization from your insurance, and we will take care of the insurance process. We will request a formal cognitive test called neuropsychological evaluation which is done by a licensed neuropsychologist. We will make a referral in that regard.  We may consider medication for memory loss down the road, depending on the results.  We will do an EEG (brainwave test), which we will schedule. We will call you with the results. I recommend we proceed with a sleep study to rule out obstructive sleep apnea.  If you have obstructive sleep apnea I will likely recommend treatment with a CPAP or AutoPap machine.

## 2024-03-26 LAB — COMPREHENSIVE METABOLIC PANEL WITH GFR
ALT: 30 IU/L (ref 0–44)
AST: 34 IU/L (ref 0–40)
Albumin: 4.7 g/dL (ref 3.9–4.9)
Alkaline Phosphatase: 73 IU/L (ref 44–121)
BUN/Creatinine Ratio: 10 (ref 10–24)
BUN: 9 mg/dL (ref 8–27)
Bilirubin Total: <0.2 mg/dL (ref 0.0–1.2)
CO2: 23 mmol/L (ref 20–29)
Calcium: 9.5 mg/dL (ref 8.6–10.2)
Chloride: 104 mmol/L (ref 96–106)
Creatinine, Ser: 0.91 mg/dL (ref 0.76–1.27)
Globulin, Total: 2.1 g/dL (ref 1.5–4.5)
Glucose: 98 mg/dL (ref 70–99)
Potassium: 4.9 mmol/L (ref 3.5–5.2)
Sodium: 141 mmol/L (ref 134–144)
Total Protein: 6.8 g/dL (ref 6.0–8.5)
eGFR: 96 mL/min/1.73 (ref >59)

## 2024-03-26 LAB — RPR: RPR Ser Ql: NONREACTIVE

## 2024-03-26 LAB — HGB A1C W/O EAG: Hgb A1c MFr Bld: 5.6 % (ref 4.8–5.6)

## 2024-03-26 LAB — B12 AND FOLATE PANEL
Folate: >20 ng/mL (ref >3.0)
Vitamin B-12: >2000 pg/mL — ABNORMAL HIGH (ref 232–1245)

## 2024-03-26 LAB — TSH: TSH: 0.63 u[IU]/mL (ref 0.450–4.500)

## 2024-03-27 ENCOUNTER — Ambulatory Visit: Payer: Self-pay | Admitting: Neurology

## 2024-03-27 ENCOUNTER — Other Ambulatory Visit

## 2024-03-27 ENCOUNTER — Encounter: Payer: Self-pay | Admitting: Psychology

## 2024-03-27 DIAGNOSIS — R351 Nocturia: Secondary | ICD-10-CM

## 2024-03-27 DIAGNOSIS — R410 Disorientation, unspecified: Secondary | ICD-10-CM

## 2024-03-27 DIAGNOSIS — R0683 Snoring: Secondary | ICD-10-CM | POA: Diagnosis not present

## 2024-03-27 DIAGNOSIS — R4789 Other speech disturbances: Secondary | ICD-10-CM | POA: Diagnosis not present

## 2024-03-27 DIAGNOSIS — F810 Specific reading disorder: Secondary | ICD-10-CM

## 2024-03-27 DIAGNOSIS — R413 Other amnesia: Secondary | ICD-10-CM

## 2024-03-27 DIAGNOSIS — Z9189 Other specified personal risk factors, not elsewhere classified: Secondary | ICD-10-CM

## 2024-03-27 MED ORDER — GADOBENATE DIMEGLUMINE 529 MG/ML IV SOLN
15.0000 mL | Freq: Once | INTRAVENOUS | Status: AC | PRN
  Administered 2024-03-27: 15 mL via INTRAVENOUS

## 2024-04-01 ENCOUNTER — Ambulatory Visit: Admitting: Neurology

## 2024-04-01 DIAGNOSIS — R410 Disorientation, unspecified: Secondary | ICD-10-CM

## 2024-04-01 DIAGNOSIS — R4182 Altered mental status, unspecified: Secondary | ICD-10-CM

## 2024-04-01 DIAGNOSIS — R413 Other amnesia: Secondary | ICD-10-CM

## 2024-04-01 DIAGNOSIS — F810 Specific reading disorder: Secondary | ICD-10-CM

## 2024-04-01 DIAGNOSIS — Z9189 Other specified personal risk factors, not elsewhere classified: Secondary | ICD-10-CM

## 2024-04-01 DIAGNOSIS — R0683 Snoring: Secondary | ICD-10-CM

## 2024-04-01 DIAGNOSIS — R4789 Other speech disturbances: Secondary | ICD-10-CM

## 2024-04-01 DIAGNOSIS — R351 Nocturia: Secondary | ICD-10-CM

## 2024-04-01 NOTE — Procedures (Signed)
   History:  63 year old man with memory loss   EEG classification:  Awake and asleep  Duration: 26 minutes   Technical aspects: This EEG study was done with scalp electrodes positioned according to the 10-20 International system of electrode placement. Electrical activity was reviewed with band pass filter of 1-70Hz , sensitivity of 7 uV/mm, display speed of 57mm/sec with a 60Hz  notched filter applied as appropriate. EEG data were recorded continuously and digitally stored.   Description of the recording: The background rhythms of this recording consists of a fairly well modulated medium amplitude background activity of 11 Hz. As the record progresses, the patient initially is in the waking state, but appears to enter the early stage II sleep during the recording, with rudimentary sleep spindles and vertex sharp wave activity seen. During the wakeful state, photic stimulation was performed, and no abnormal responses were seen. Hyperventilation was also performed, no abnormal response seen. No epileptiform discharges seen during this recording. There was no focal slowing.   Abnormality: None   Impression: This is a normal awake and sleep EEG. No evidence of interictal epileptiform discharges. Normal EEGs, however, do not rule out epilepsy.    Janelys Glassner, MD [facility]

## 2024-04-04 DIAGNOSIS — M5451 Vertebrogenic low back pain: Secondary | ICD-10-CM | POA: Diagnosis not present

## 2024-04-04 DIAGNOSIS — M5431 Sciatica, right side: Secondary | ICD-10-CM | POA: Diagnosis not present

## 2024-04-04 DIAGNOSIS — M5432 Sciatica, left side: Secondary | ICD-10-CM | POA: Diagnosis not present

## 2024-04-04 DIAGNOSIS — M9903 Segmental and somatic dysfunction of lumbar region: Secondary | ICD-10-CM | POA: Diagnosis not present

## 2024-04-13 ENCOUNTER — Other Ambulatory Visit: Payer: Self-pay | Admitting: Family Medicine

## 2024-04-13 DIAGNOSIS — I159 Secondary hypertension, unspecified: Secondary | ICD-10-CM

## 2024-04-18 ENCOUNTER — Ambulatory Visit: Payer: Federal, State, Local not specified - PPO | Admitting: Family Medicine

## 2024-04-19 DIAGNOSIS — M9903 Segmental and somatic dysfunction of lumbar region: Secondary | ICD-10-CM | POA: Diagnosis not present

## 2024-04-19 DIAGNOSIS — M5431 Sciatica, right side: Secondary | ICD-10-CM | POA: Diagnosis not present

## 2024-04-19 DIAGNOSIS — M5432 Sciatica, left side: Secondary | ICD-10-CM | POA: Diagnosis not present

## 2024-04-19 DIAGNOSIS — M5451 Vertebrogenic low back pain: Secondary | ICD-10-CM | POA: Diagnosis not present

## 2024-05-21 ENCOUNTER — Emergency Department (HOSPITAL_COMMUNITY)
Admission: EM | Admit: 2024-05-21 | Discharge: 2024-05-21 | Disposition: A | Discharge status: Home / Self Care | Attending: Emergency Medicine | Admitting: Emergency Medicine

## 2024-05-21 ENCOUNTER — Other Ambulatory Visit: Payer: Self-pay

## 2024-05-21 ENCOUNTER — Ambulatory Visit: Admitting: Psychology

## 2024-05-21 ENCOUNTER — Emergency Department (HOSPITAL_COMMUNITY)

## 2024-05-21 DIAGNOSIS — J45909 Unspecified asthma, uncomplicated: Secondary | ICD-10-CM | POA: Insufficient documentation

## 2024-05-21 DIAGNOSIS — R1031 Right lower quadrant pain: Secondary | ICD-10-CM | POA: Insufficient documentation

## 2024-05-21 DIAGNOSIS — K7689 Other specified diseases of liver: Secondary | ICD-10-CM | POA: Diagnosis not present

## 2024-05-21 DIAGNOSIS — R102 Pelvic and perineal pain: Secondary | ICD-10-CM

## 2024-05-21 DIAGNOSIS — Z79899 Other long term (current) drug therapy: Secondary | ICD-10-CM | POA: Insufficient documentation

## 2024-05-21 DIAGNOSIS — I10 Essential (primary) hypertension: Secondary | ICD-10-CM | POA: Insufficient documentation

## 2024-05-21 DIAGNOSIS — R109 Unspecified abdominal pain: Secondary | ICD-10-CM | POA: Diagnosis not present

## 2024-05-21 DIAGNOSIS — N281 Cyst of kidney, acquired: Secondary | ICD-10-CM | POA: Diagnosis not present

## 2024-05-21 DIAGNOSIS — Z7951 Long term (current) use of inhaled steroids: Secondary | ICD-10-CM | POA: Insufficient documentation

## 2024-05-21 DIAGNOSIS — K8020 Calculus of gallbladder without cholecystitis without obstruction: Secondary | ICD-10-CM | POA: Diagnosis not present

## 2024-05-21 LAB — CBC WITH DIFFERENTIAL/PLATELET
Abs Immature Granulocytes: 0 K/uL (ref 0.00–0.07)
Basophils Absolute: 0 K/uL (ref 0.0–0.1)
Basophils Relative: 1 %
Eosinophils Absolute: 0.1 K/uL (ref 0.0–0.5)
Eosinophils Relative: 3 %
HCT: 44.8 % (ref 39.0–52.0)
Hemoglobin: 14.9 g/dL (ref 13.0–17.0)
Immature Granulocytes: 0 %
Lymphocytes Relative: 44 %
Lymphs Abs: 1.4 K/uL (ref 0.7–4.0)
MCH: 29.7 pg (ref 26.0–34.0)
MCHC: 33.3 g/dL (ref 30.0–36.0)
MCV: 89.4 fL (ref 80.0–100.0)
Monocytes Absolute: 0.5 K/uL (ref 0.1–1.0)
Monocytes Relative: 17 %
Neutro Abs: 1.1 K/uL — ABNORMAL LOW (ref 1.7–7.7)
Neutrophils Relative %: 35 %
Platelets: 200 K/uL (ref 150–400)
RBC: 5.01 MIL/uL (ref 4.22–5.81)
RDW: 12.8 % (ref 11.5–15.5)
WBC: 3.1 K/uL — ABNORMAL LOW (ref 4.0–10.5)
nRBC: 0 % (ref 0.0–0.2)

## 2024-05-21 LAB — COMPREHENSIVE METABOLIC PANEL WITH GFR
ALT: 36 U/L (ref 0–44)
AST: 34 U/L (ref 15–41)
Albumin: 4 g/dL (ref 3.5–5.0)
Alkaline Phosphatase: 63 U/L (ref 38–126)
Anion gap: 9 (ref 5–15)
BUN: 6 mg/dL — ABNORMAL LOW (ref 8–23)
CO2: 24 mmol/L (ref 22–32)
Calcium: 8.8 mg/dL — ABNORMAL LOW (ref 8.9–10.3)
Chloride: 105 mmol/L (ref 98–111)
Creatinine, Ser: 1.07 mg/dL (ref 0.61–1.24)
GFR, Estimated: >60 mL/min (ref >60)
Glucose, Bld: 93 mg/dL (ref 70–99)
Potassium: 3.8 mmol/L (ref 3.5–5.1)
Sodium: 138 mmol/L (ref 135–145)
Total Bilirubin: 0.7 mg/dL (ref 0.0–1.2)
Total Protein: 6.8 g/dL (ref 6.5–8.1)

## 2024-05-21 LAB — URINALYSIS, W/ REFLEX TO CULTURE (INFECTION SUSPECTED)
Bacteria, UA: NONE SEEN
Bilirubin Urine: NEGATIVE
Glucose, UA: NEGATIVE mg/dL
Hgb urine dipstick: NEGATIVE
Ketones, ur: NEGATIVE mg/dL
Leukocytes,Ua: NEGATIVE
Nitrite: NEGATIVE
Protein, ur: NEGATIVE mg/dL
Specific Gravity, Urine: 1.003 — ABNORMAL LOW (ref 1.005–1.030)
pH: 7 (ref 5.0–8.0)

## 2024-05-21 LAB — MAGNESIUM: Magnesium: 2 mg/dL (ref 1.7–2.4)

## 2024-05-21 LAB — LIPASE, BLOOD: Lipase: 25 U/L (ref 11–51)

## 2024-05-21 MED ORDER — OXYCODONE-ACETAMINOPHEN 5-325 MG PO TABS
1.0000 | ORAL_TABLET | ORAL | Status: DC | PRN
  Administered 2024-05-21: 1 via ORAL
  Filled 2024-05-21: qty 1

## 2024-05-21 MED ORDER — IOHEXOL 300 MG/ML  SOLN
100.0000 mL | Freq: Once | INTRAMUSCULAR | Status: AC | PRN
  Administered 2024-05-21: 100 mL via INTRAVENOUS

## 2024-05-21 MED ORDER — TAMSULOSIN HCL 0.4 MG PO CAPS: 0.4000 mg | ORAL_CAPSULE | Freq: Every day | ORAL | 2 refills | Status: AC

## 2024-05-21 NOTE — ED Triage Notes (Signed)
 Pt arrived POV from home. Pt arrived to ED with complaints of pain in his bladder that rotates around to his right back. Pt states that his has seen a urologist and that today the pain is getting worse. Pain is rated 7/10. This has been ongoing for several months per pt. Pt states he has pressure when he urinates and irregularities when the flow is happening. Pt states he has pain in his back upon palpation. MD present during triage.

## 2024-05-21 NOTE — Discharge Instructions (Addendum)
 Your test results today were reassuring.    If you would like to do a bowel cleanout, take high-dose MiraLAX  at home.  This is available over-the-counter.   A prescription for medication called tamsulosin  was sent to your pharmacy.  This can help with urinary hesitancy and frequency.  Take daily as prescribed and follow-up with your primary care doctor.   Return to the emergency department for any new or worsening symptoms of concern.

## 2024-05-21 NOTE — ED Notes (Addendum)
 MD completed bladder scan. 350 ml present on ultrasound. Pt states he voided 10 mins ago.

## 2024-05-21 NOTE — Consult Note (Addendum)
 I was present with the medical student for this service. I personally verified the history of present illness, performed the physical exam, and made the plan for this encounter. I have verified the medical student's documentation and made modifications where appropriately. I have personally documented in my own words a brief history, physical, and plan below.      Personally reviewed CT and showed patient and wife. Appendix is 8 mm, has air in it so it is not obliterated and there is no stranding or signs of any inflammation. I do not think this is appendicitis. He had pain in the suprapubic region just above and right of the pubic symphysis area. This is in the region of the inguinal canal but no obvious hernia on exam or on CT. CT also with gallstones but no RUQ pain and no nausea/ vomiting.   He had a colonoscopy 2024 that removed some benign polyps.   He denies any bulge.  He is having Bms.  At this time, I do not think this is appendicitis. I cannot say why he is having pain at this specific point. It is very point like tenderness with some radiation into the flank.   I told patient I would see if another radiologist would look at the scan to ensure we are not missing anything, and I will call him with that opinion.  No surgical intervention indicated. Return precautions given  Manuelita Pander, MD [facility] [address] [place] [address], [place] [address] [phone] (office)  Reason for Consult: Possible appendicitis Referring Physician: Bernardino Fireman, MD  George Garcia is an 63 y.o. male.   HPI: On interview, he reports coming in for suprapubic and right flank pain that he has been experiencing for the past few months. He describes the pain as a pressure sensation over the right suprapubic area that is worse with urination. He endorses worsening of the pressure with a full bladder and relief upon emptying. He reports radiation of his pressure from the right  suprapubic area along the lower aspect of his right quadrant to his right flank. He describes the pain as a 3-4 out of 10 without medication and describes it as intermittent in nature. He reports experiencing flares where the pain is worse for periods of about a week. He endorses presenting to his PCP and urology for his pain. He was treated for possible UTI with Bactrim  which provided temporary relief. He states that his pain returned shortly after finishing his course of antibiotics. He denies any history of fevers, chills, sweating, nausea, vomiting, hematuria, blood/mucus in his stool, changes in bowel patterns or RLQ pain. He reports no other associated symptoms with his pain today.  Past Medical History:  Diagnosis Date   Allergy    Asthma    GERD (gastroesophageal reflux disease)    Hypertension    IBS (irritable bowel syndrome)    [facility] spotted fever    2020 and 2021   Wears glasses     Past Surgical History:  Procedure Laterality Date   colonscopy  2019   HEMORRHOID SURGERY N/A 09/01/2021   Procedure: SINGLE COLUMN HEMORRHOIDECTOMY;  Surgeon: Debby Hila, MD;  Location: [facility];  Service: General;  Laterality: N/A;   SHOULDER SURGERY Right 2007   rotator cuff repair    Family History  Problem Relation Age of Onset   Stroke Mother    Diabetes Mother    Stroke Father    Kidney disease Sister  Kidney disease Brother    Fibroids Daughter    Healthy Daughter    Healthy Daughter    Healthy Son    Colon cancer Neg Hx    Cancer - Colon Neg Hx    Rectal cancer Neg Hx    Stomach cancer Neg Hx    Esophageal cancer Neg Hx    Colon polyps Neg Hx     Social History:  reports that he has never smoked. He has never used smokeless tobacco. He reports that he does not drink alcohol and does not use drugs.  Allergies: No Known Allergies  Medications: I have reviewed the patient's current medications. Current Facility-Administered Medications   Medication Dose Route Frequency Provider Last Rate Last Admin   oxyCODONE -acetaminophen  (PERCOCET/ROXICET) 5-325 MG per tablet 1 tablet  1 tablet Oral Q3H PRN Melvenia Motto, MD   1 tablet at 05/21/24 9046   Current Outpatient Medications  Medication Sig Dispense Refill Last Dose/Taking   tamsulosin  (FLOMAX ) 0.4 MG CAPS capsule Take 1 capsule (0.4 mg total) by mouth daily. 30 capsule 2 Taking   albuterol  (VENTOLIN  HFA) 108 (90 Base) MCG/ACT inhaler Inhale 2 puffs into the lungs every 6 (six) hours as needed for wheezing or shortness of breath. 18 g 3    amLODipine  (NORVASC ) 5 MG tablet TAKE 1 TABLET (5 MG TOTAL) BY MOUTH DAILY. 90 tablet 0    Camphor-Menthol-Methyl Sal (HM SALONPAS  PAIN RELIEF ) 1.2-5.7-6.3 % PTCH Apply 1 patch topically daily as needed. 40 patch 11    cyclobenzaprine  (FLEXERIL ) 10 MG tablet Take 1 tablet (10 mg total) by mouth 3 (three) times daily as needed for muscle spasms. 30 tablet 1    Diclofenac Sodium (PENNSAID) 2 % SOLN Apply topically daily as needed.      dicyclomine  (BENTYL ) 20 MG tablet TAKE 1 TABLET (20 MG TOTAL) BY MOUTH EVERY 6 (SIX) HOURS. 90 tablet 3    fexofenadine  (ALLEGRA) 180 MG tablet Take 180 mg by mouth daily. (Patient not taking: Reported on 03/25/2024)      fluticasone  (FLONASE ) 50 MCG/ACT nasal spray PLACE 1 SPRAY INTO BOTH NOSTRILS 2 (TWO) TIMES DAILY AS NEEDED FOR ALLERGIES OR RHINITIS. 48 mL 1    hydrocortisone (ANUSOL-HC) 25 MG suppository UNWRAP AND INSERT 1 SUPPOSITORY 3 TIMES DAILY RECTALLY 21 suppository 1    ibuprofen  (ADVIL ) 800 MG tablet Take 800 mg by mouth 3 (three) times daily.      Ibuprofen -Famotidine (DUEXIS) 800-26.6 MG TABS Take by mouth as needed.      lactulose  (CHRONULAC ) 10 GM/15ML solution TAKE 30 MLS (20 G TOTAL) BY MOUTH 2 (TWO) TIMES DAILY. 1800 mL 7    lidocaine  (LIDODERM ) 5 % 1 patch as needed.      loratadine  (CLARITIN ) 10 MG tablet Take 1 tablet (10 mg total) by mouth daily. 90 tablet 3    methocarbamol  (ROBAXIN ) 500 MG  tablet Take 1 tablet (500 mg total) by mouth 4 (four) times daily. (Patient not taking: Reported on 03/25/2024) 30 tablet 1    montelukast  (SINGULAIR ) 10 MG tablet TAKE 1 TABLET BY MOUTH EVERYDAY AT BEDTIME (Patient not taking: Reported on 03/25/2024) 90 tablet 3    omeprazole  (PRILOSEC) 40 MG capsule Take 1 capsule (40 mg total) by mouth in the morning and at bedtime. 180 capsule 3    ondansetron  (ZOFRAN ) 4 MG tablet Take 1 tablet (4 mg total) by mouth every 8 (eight) hours as needed for nausea or vomiting. 30 tablet 2    rosuvastatin  (CRESTOR ) 5  MG tablet Take 1 tablet (5 mg total) by mouth daily. 90 tablet 3     Results for orders placed or performed during the hospital encounter of 05/21/24 (from the past 48 hours)  Comprehensive metabolic panel     Status: Abnormal   Collection Time: 05/21/24  9:13 AM  Result Value Ref Range   Sodium 138 135 - 145 mmol/L   Potassium 3.8 3.5 - 5.1 mmol/L   Chloride 105 98 - 111 mmol/L   CO2 24 22 - 32 mmol/L   Glucose, Bld 93 70 - 99 mg/dL    Comment: Glucose reference range applies only to samples taken after fasting for at least 8 hours.   BUN 6 (L) 8 - 23 mg/dL   Creatinine, Ser 8.92 0.61 - 1.24 mg/dL   Calcium  8.8 (L) 8.9 - 10.3 mg/dL   Total Protein 6.8 6.5 - 8.1 g/dL   Albumin 4.0 3.5 - 5.0 g/dL   AST 34 15 - 41 U/L   ALT 36 0 - 44 U/L   Alkaline Phosphatase 63 38 - 126 U/L   Total Bilirubin 0.7 0.0 - 1.2 mg/dL   GFR, Estimated >39 >39 mL/min    Comment: (NOTE) Calculated using the CKD-EPI Creatinine Equation (2021)    Anion gap 9 5 - 15    Comment: Performed at [facility], [address] [place], [place] [address]  Lipase, blood     Status: None   Collection Time: 05/21/24  9:13 AM  Result Value Ref Range   Lipase 25 11 - 51 U/L    Comment: Performed at [facility], [address], [place], [place] [address]  CBC with Diff     Status: Abnormal   Collection Time: 05/21/24  9:13 AM  Result Value Ref Range   WBC 3.1 (L) 4.0 - 10.5 K/uL    RBC 5.01 4.22 - 5.81 MIL/uL   Hemoglobin 14.9 13.0 - 17.0 g/dL   HCT 55.1 60.9 - 47.9 %   MCV 89.4 80.0 - 100.0 fL   MCH 29.7 26.0 - 34.0 pg   MCHC 33.3 30.0 - 36.0 g/dL   RDW 87.1 88.4 - 84.4 %   Platelets 200 150 - 400 K/uL   nRBC 0.0 0.0 - 0.2 %   Neutrophils Relative % 35 %   Neutro Abs 1.1 (L) 1.7 - 7.7 K/uL   Lymphocytes Relative 44 %   Lymphs Abs 1.4 0.7 - 4.0 K/uL   Monocytes Relative 17 %   Monocytes Absolute 0.5 0.1 - 1.0 K/uL   Eosinophils Relative 3 %   Eosinophils Absolute 0.1 0.0 - 0.5 K/uL   Basophils Relative 1 %   Basophils Absolute 0.0 0.0 - 0.1 K/uL   Immature Granulocytes 0 %   Abs Immature Granulocytes 0.00 0.00 - 0.07 K/uL    Comment: Performed at [facility], [address], [place], [place] [address]  Magnesium     Status: None   Collection Time: 05/21/24  9:13 AM  Result Value Ref Range   Magnesium 2.0 1.7 - 2.4 mg/dL    Comment: Performed at [facility], [address], [place], [place] [address]  Urinalysis, w/ Reflex to Culture (Infection Suspected) -Urine, Clean Catch     Status: Abnormal   Collection Time: 05/21/24  9:56 AM  Result Value Ref Range   Specimen Source URINE, CLEAN CATCH    Color, Urine COLORLESS (A) YELLOW   APPearance CLEAR CLEAR   Specific Gravity, Urine 1.003 (L) 1.005 -  1.030   pH 7.0 5.0 - 8.0   Glucose, UA NEGATIVE NEGATIVE mg/dL   Hgb urine dipstick NEGATIVE NEGATIVE   Bilirubin Urine NEGATIVE NEGATIVE   Ketones, ur NEGATIVE NEGATIVE mg/dL   Protein, ur NEGATIVE NEGATIVE mg/dL   Nitrite NEGATIVE NEGATIVE   Leukocytes,Ua NEGATIVE NEGATIVE   RBC / HPF 0-5 0 - 5 RBC/hpf   WBC, UA 0-5 0 - 5 WBC/hpf    Comment:        Reflex urine culture not performed if WBC <=10, OR if Squamous epithelial cells >5. If Squamous epithelial cells >5 suggest recollection.    Bacteria, UA NONE SEEN NONE SEEN   Squamous Epithelial / HPF 0-5 0 - 5 /HPF    Comment: Performed at [facility], [address], [place], [place] [address]    CT  ABDOMEN PELVIS W CONTRAST Result Date: 05/21/2024 CLINICAL DATA:  Abdominal pain, stone suspected EXAM: CT ABDOMEN AND PELVIS WITH CONTRAST TECHNIQUE: Multidetector CT imaging of the abdomen and pelvis was performed using the standard protocol following bolus administration of intravenous contrast. RADIATION DOSE REDUCTION: This exam was performed according to the departmental dose-optimization program which includes automated exposure control, adjustment of the mA and/or kV according to patient size and/or use of iterative reconstruction technique. CONTRAST:  OMNIPAQUE  IOHEXOL  300 MG/ML  SOLN COMPARISON:  None Available. FINDINGS: Lower chest: No acute abnormality. Hepatobiliary: Subcentimeter right hepatic lobe cyst. Cholelithiasis. Normal gallbladder wall thickness. No intra or extrahepatic biliary dilatation. Pancreas: Unremarkable. No pancreatic ductal dilatation or surrounding inflammatory changes. Spleen: Normal in size without focal abnormality. Adrenals/Urinary Tract: Adrenal glands are unremarkable. Subcentimeter renal cortical cysts which does not require imaging follow-up, otherwise kidneys are normal, without solid renal calculi, focal lesion, or hydronephrosis. Bladder is unremarkable. Stomach/Bowel: Stomach is within normal limits. Appendix is distended by gas measures up to 8 mm. No secondary signs of appendicitis identified. No evidence of bowel wall thickening, distention, or inflammatory changes. Vascular/Lymphatic: No significant vascular findings are present. No enlarged abdominal or pelvic lymph nodes. Reproductive: Moderate prostatomegaly protruding to the bladder base. Seminal vesicles are prominent. Other: None Musculoskeletal: Degenerative changes of L4-L5.  Sacralization of L5 IMPRESSION: Cholelithiasis.  No CT findings to suggest cholecystitis. Gas distended appendix without secondary signs of appendicitis. Correlate with clinical findings. Prostatomegaly and prominent seminal  vesicles. Recommend correlation with PSA levels. Degenerative disc disease of L4-L5. Electronically Signed   By: Megan  Zare M.D.   On: 05/21/2024 11:25    ROS:  Pertinent items are noted in HPI.  Blood pressure 129/84, pulse (!) 58, temperature 98.7 F (37.1 C), resp. rate 18, height 5' 10 (1.778 m), weight 81.2 kg, SpO2 97%.  Physical Exam:  Vitals: Reviewed Consitutional: Not in acute distress. Normal appearance. No diaphoresis. GI: Abdomen soft, supple, non-tender, and non-distended in all four quadrants. Negative Psoas, McBurney, and Rovsing sign. Positive CVA tenderness in right flank. Negative CVA tenderness in left flank. GU: Sensitive exam not performed. Tenderness along right suprapubic region. No tenderness along left suprapubic region.  Assessment/Plan: George Garcia is a 63 yo male who presented to the emergency department for a few month history of intermittent right suprapubic tenderness and right CVA tenderness. Emergency department had concern for possible appendicitis given chronic history of worsening pain near the RLQ and CT scan showing gas-distended appendix. He denies any associated fevers, chills, nausea, vomiting, or RLQ pain. Physical exam was negative for abdominal tenderness, RLQ pain, and appendicitis maneuvers, moving appendicitis lower on the differential. Differential  for right suprapubic tenderness includes hernia. We will reach out to radiology for updated read on CT scan performed today. - Patient's symptoms and exam not consistent with CT findings of appendicitis - Follow up with outpatient providers (PCP and urology) as scheduled.  Elia LULLA Blanch 05/21/2024, 2:04 PM

## 2024-05-21 NOTE — ED Provider Notes (Signed)
 George Garcia EMERGENCY DEPARTMENT AT [facility] Provider Note   CSN: [id number] Arrival date & time: 05/21/24  9168     Patient presents with: Flank Pain   George Garcia is a 63 y.o. male.   HPI Patient presents for right flank pain.  Medical history includes IBS, GERD, chronic pain, HLD, HTN, asthma.  For the past Avril months, he has had pain in his right flank.  He has been seen by his PCP and referred to urology.  He states that he followed up with urology and was told that everything looked okay.  He was advised to go on a liquid diet for 2 weeks.  He says he did this but it did not seem to improve his symptoms.  He has returned to eating solid foods.  He has had worsening discomfort.  He describes area of discomfort as suprapubic, right lower quadrant, right flank, and right lower back.  His lower abdominal pain seems to worsen with urination.  He denies any dysuria.  He will have difficulty urinating at times.  Current pain is 4/10 in severity.    Prior to Admission medications   Medication Sig Start Date End Date Taking? Authorizing Provider  tamsulosin  (FLOMAX ) 0.4 MG CAPS capsule Take 1 capsule (0.4 mg total) by mouth daily. 05/21/24 08/19/24 Yes Melvenia Motto, MD  albuterol  (VENTOLIN  HFA) 108 (90 Base) MCG/ACT inhaler Inhale 2 puffs into the lungs every 6 (six) hours as needed for wheezing or shortness of breath. 10/06/19   Dettinger, Fonda LABOR, MD  amLODipine  (NORVASC ) 5 MG tablet TAKE 1 TABLET (5 MG TOTAL) BY MOUTH DAILY. 04/15/24   Dettinger, Fonda LABOR, MD  Camphor-Menthol-Methyl Sal (HM SALONPAS  PAIN RELIEF ) 1.2-5.7-6.3 % PTCH Apply 1 patch topically daily as needed. 01/06/20   Dettinger, Fonda LABOR, MD  cyclobenzaprine  (FLEXERIL ) 10 MG tablet Take 1 tablet (10 mg total) by mouth 3 (three) times daily as needed for muscle spasms. 10/20/23   Dettinger, Fonda LABOR, MD  Diclofenac Sodium (PENNSAID) 2 % SOLN Apply topically daily as needed.    [provider]  dicyclomine  (BENTYL )  20 MG tablet TAKE 1 TABLET (20 MG TOTAL) BY MOUTH EVERY 6 (SIX) HOURS. 09/23/19   Dettinger, Fonda LABOR, MD  fexofenadine  (ALLEGRA) 180 MG tablet Take 180 mg by mouth daily. Patient not taking: Reported on 03/25/2024    [provider]  fluticasone  (FLONASE ) 50 MCG/ACT nasal spray PLACE 1 SPRAY INTO BOTH NOSTRILS 2 (TWO) TIMES DAILY AS NEEDED FOR ALLERGIES OR RHINITIS. 12/25/23   Dettinger, Fonda LABOR, MD  hydrocortisone (ANUSOL-HC) 25 MG suppository UNWRAP AND INSERT 1 SUPPOSITORY 3 TIMES DAILY RECTALLY 10/25/23   Dettinger, Fonda LABOR, MD  ibuprofen  (ADVIL ) 800 MG tablet Take 800 mg by mouth 3 (three) times daily. 11/06/22   [provider]  Ibuprofen -Famotidine (DUEXIS) 800-26.6 MG TABS Take by mouth as needed.    [provider]  lactulose  (CHRONULAC ) 10 GM/15ML solution TAKE 30 MLS (20 G TOTAL) BY MOUTH 2 (TWO) TIMES DAILY. 12/01/23   Federico Rosario BROCKS, MD  lidocaine  (LIDODERM ) 5 % 1 patch as needed. 04/14/20   [provider]  loratadine  (CLARITIN ) 10 MG tablet Take 1 tablet (10 mg total) by mouth daily. 06/07/23   Dettinger, Fonda LABOR, MD  methocarbamol  (ROBAXIN ) 500 MG tablet Take 1 tablet (500 mg total) by mouth 4 (four) times daily. Patient not taking: Reported on 03/25/2024 03/30/22   Ijaola, Onyeje M, NP  montelukast  (SINGULAIR ) 10 MG tablet TAKE 1 TABLET  BY MOUTH EVERYDAY AT BEDTIME Patient not taking: Reported on 03/25/2024 10/30/23   Dettinger, Fonda LABOR, MD  omeprazole  (PRILOSEC) 40 MG capsule Take 1 capsule (40 mg total) by mouth in the morning and at bedtime. 06/07/23   Dettinger, Fonda LABOR, MD  ondansetron  (ZOFRAN ) 4 MG tablet Take 1 tablet (4 mg total) by mouth every 8 (eight) hours as needed for nausea or vomiting. 02/15/22   Zollie Lowers, MD  rosuvastatin  (CRESTOR ) 5 MG tablet Take 1 tablet (5 mg total) by mouth daily. 06/14/23   Dettinger, Fonda LABOR, MD    Allergies: Patient has no known allergies.    Review of Systems  Gastrointestinal:  Positive for abdominal pain.   Genitourinary:  Positive for flank pain.  Musculoskeletal:  Positive for back pain.  All other systems reviewed and are negative.   Updated Vital Signs BP 130/87   Pulse (!) 50   Temp 98.7 F (37.1 C)   Resp 18   Ht 5' 10 (1.778 m)   Wt 81.2 kg   SpO2 98%   BMI 25.69 kg/m   Physical Exam Vitals and nursing note reviewed.  Constitutional:      General: He is not in acute distress.    Appearance: Normal appearance. He is well-developed. He is not ill-appearing, toxic-appearing or diaphoretic.  HENT:     Head: Normocephalic and atraumatic.     Right Ear: External ear normal.     Left Ear: External ear normal.     Nose: Nose normal.     Mouth/Throat:     Mouth: Mucous membranes are moist.  Eyes:     Extraocular Movements: Extraocular movements intact.     Conjunctiva/sclera: Conjunctivae normal.  Cardiovascular:     Rate and Rhythm: Normal rate and regular rhythm.  Pulmonary:     Effort: Pulmonary effort is normal. No respiratory distress.  Abdominal:     Palpations: Abdomen is soft.     Tenderness: There is abdominal tenderness. There is right CVA tenderness. There is no left CVA tenderness, guarding or rebound.  Musculoskeletal:        General: No swelling. Normal range of motion.     Cervical back: Normal range of motion.  Skin:    General: Skin is warm and dry.     Coloration: Skin is not jaundiced or pale.  Neurological:     General: No focal deficit present.     Mental Status: He is alert and oriented to person, place, and time.  Psychiatric:        Mood and Affect: Mood normal.        Behavior: Behavior normal.     (all labs ordered are listed, but only abnormal results are displayed) Labs Reviewed  URINALYSIS, W/ REFLEX TO CULTURE (INFECTION SUSPECTED) - Abnormal; Notable for the following components:      Result Value   Color, Urine COLORLESS (*)    Specific Gravity, Urine 1.003 (*)    All other components within normal limits  COMPREHENSIVE  METABOLIC PANEL WITH GFR - Abnormal; Notable for the following components:   BUN 6 (*)    Calcium  8.8 (*)    All other components within normal limits  CBC WITH DIFFERENTIAL/PLATELET - Abnormal; Notable for the following components:   WBC 3.1 (*)    Neutro Abs 1.1 (*)    All other components within normal limits  LIPASE, BLOOD  MAGNESIUM    EKG: None  Radiology: CT ABDOMEN PELVIS W CONTRAST Result Date: 05/21/2024  CLINICAL DATA:  Abdominal pain, stone suspected EXAM: CT ABDOMEN AND PELVIS WITH CONTRAST TECHNIQUE: Multidetector CT imaging of the abdomen and pelvis was performed using the standard protocol following bolus administration of intravenous contrast. RADIATION DOSE REDUCTION: This exam was performed according to the departmental dose-optimization program which includes automated exposure control, adjustment of the mA and/or kV according to patient size and/or use of iterative reconstruction technique. CONTRAST:  OMNIPAQUE  IOHEXOL  300 MG/ML  SOLN COMPARISON:  None Available. FINDINGS: Lower chest: No acute abnormality. Hepatobiliary: Subcentimeter right hepatic lobe cyst. Cholelithiasis. Normal gallbladder wall thickness. No intra or extrahepatic biliary dilatation. Pancreas: Unremarkable. No pancreatic ductal dilatation or surrounding inflammatory changes. Spleen: Normal in size without focal abnormality. Adrenals/Urinary Tract: Adrenal glands are unremarkable. Subcentimeter renal cortical cysts which does not require imaging follow-up, otherwise kidneys are normal, without solid renal calculi, focal lesion, or hydronephrosis. Bladder is unremarkable. Stomach/Bowel: Stomach is within normal limits. Appendix is distended by gas measures up to 8 mm. No secondary signs of appendicitis identified. No evidence of bowel wall thickening, distention, or inflammatory changes. Vascular/Lymphatic: No significant vascular findings are present. No enlarged abdominal or pelvic lymph nodes.  Reproductive: Moderate prostatomegaly protruding to the bladder base. Seminal vesicles are prominent. Other: None Musculoskeletal: Degenerative changes of L4-L5.  Sacralization of L5 IMPRESSION: Cholelithiasis.  No CT findings to suggest cholecystitis. Gas distended appendix without secondary signs of appendicitis. Correlate with clinical findings. Prostatomegaly and prominent seminal vesicles. Recommend correlation with PSA levels. Degenerative disc disease of L4-L5. Electronically Signed   By: Megan  Zare M.D.   On: 05/21/2024 11:25     Procedures   Medications Ordered in the ED  oxyCODONE -acetaminophen  (PERCOCET/ROXICET) 5-325 MG per tablet 1 tablet (1 tablet Oral Given 05/21/24 0953)  iohexol  (OMNIPAQUE ) 300 MG/ML solution 100 mL (100 mLs Intravenous Contrast Given 05/21/24 1025)                                    Medical Decision Making Amount and/or Complexity of Data Reviewed Labs: ordered. Radiology: ordered.  Risk Prescription drug management.   This patient presents to the ED for concern of right flank pain, this involves an extensive number of treatment options, and is a complaint that carries with it a high risk of complications and morbidity.  The differential diagnosis includes nephrolithiasis, hydronephrosis, UTI, constipation, colitis, appendicitis   Co morbidities / Chronic conditions that complicate the patient evaluation  IBS, GERD, chronic pain, HLD, HTN, asthma   Additional history obtained:  Additional history obtained from EMR External records from outside source obtained and reviewed including N/A   Lab Tests:  I Ordered, and personally interpreted labs.  The pertinent results include: Normal hemoglobin, baseline leukopenia, normal kidney function, normal electrolytes, no evidence of UTI or hematuria   Imaging Studies ordered:  I ordered imaging studies including CT of abdomen and pelvis I independently visualized and interpreted imaging which showed  gas distended appendix without any other signs appendicitis. I agree with the radiologist interpretation   Cardiac Monitoring: / EKG:  The patient was maintained on a cardiac monitor.  I personally viewed and interpreted the cardiac monitored which showed an underlying rhythm of: Sinus rhythm   Problem List / ED Course / Critical interventions / Medication management  Patient presenting for worsening right flank and lower abdominal pain.  He states that it has been ongoing for the past several months.  Per chart review  he was seen by Dr. Matilda in May for urinary complaints.  This was after he had taken a 10-day course of Bactrim , prescribed by PCP.  Per urology note, Bactrim  seem to improve his symptoms.  He was advised to practice dietary restraint, limiting acid in his diet and increasing water intake.  Plan was for 6-week follow-up.  On exam today, patient is overall well-appearing.  He does have some right CVA and right lower abdomen tenderness.  He urinated 10 minutes prior to being bedded in the ED.  On bedside bladder scan, he has approximately 350 cc of urine retained in his bladder.  This does cause concern of urine retention.  Patient at this time is not amenable to catheter placement.  Patient was encouraged to attempt to urinate again today.  His current pain is 4/10 in severity.  He declines any pain medication at this time.  Will obtain urinalysis, serum lab work, and CT imaging.  Lab work and urinalysis were reassuring.  CT imaging showed a gas distended appendix without any other signs of appendicitis.  Given that he is having some pain in this location, I reached out to general surgeon on-call, Dr. Kallie.  Dr. Kallie evaluated the patient and does not feel that this is consistent with appendicitis.  Patient does have a moderate stool burden, primarily on the right side.  Currently, he is taking lactulose  daily to maintain soft stools.  He was advised to take MiraLAX  for bowel  cleanout to help with his symptoms.  He had no worsening of symptoms while in the ED.  He was discharged in stable condition. I ordered medication including Percocet for analgesia Reevaluation of the patient after these medicines showed that the patient improved I have reviewed the patients home medicines and have made adjustments as needed   Consultations Obtained:  I requested consultation with the general surgeon, Dr. Kallie,  and discussed lab and imaging findings as well as pertinent plan - they recommend: Exam and CT not consistent with appendicitis.   Social Determinants of Health:  Has access to outpatient care     Final diagnoses:  Right flank pain    ED Discharge Orders          Ordered    tamsulosin  (FLOMAX ) 0.4 MG CAPS capsule  Daily        05/21/24 1317               Melvenia Motto, MD 05/21/24 1317

## 2024-05-22 ENCOUNTER — Telehealth (INDEPENDENT_AMBULATORY_CARE_PROVIDER_SITE_OTHER): Admitting: General Surgery

## 2024-05-22 DIAGNOSIS — R102 Pelvic and perineal pain: Secondary | ICD-10-CM

## 2024-05-22 NOTE — Telephone Encounter (Signed)
 [facility]  Patient seen in the ED yesterday.  I had his CT scan reviewed by Dr. Jennefer IR to see if he had any other thoughts or ideas for the specific suprapubic pain /right of midline in the area of the inguinal region. There is no obvious hernia or reason for that area to have this pain.  I have discussed with the patient that some people can get sports hernias or athletic pubalgia which are not true hernias but is groin pain related to activity. I wonder if this could be some of what he is dealing with.  He has no other urinary symptoms.  I told him if he develops a bulge to let us  know. I discussed that if he develops other symptoms of UTI to let Dr. Maryanne know.   Discussed rest, ice, and NSAIDs for treatment of the sports hernia  symptoms.   Discussed that constipation can cause pain but usually not point tenderness and pain.   Discussed that rarely we some times have to do a diagnostic laparoscopy if someone has unexplained pain but we try to avoid this given the additional areas of pain and scarring created with such a measure.  He will follow up with his PCP.  Manuelita Pander, MD [facility] [address], [place] [id number] [phone] (office)

## 2024-05-24 DIAGNOSIS — M9903 Segmental and somatic dysfunction of lumbar region: Secondary | ICD-10-CM | POA: Diagnosis not present

## 2024-05-24 DIAGNOSIS — M5451 Vertebrogenic low back pain: Secondary | ICD-10-CM | POA: Diagnosis not present

## 2024-05-24 DIAGNOSIS — M5432 Sciatica, left side: Secondary | ICD-10-CM | POA: Diagnosis not present

## 2024-05-24 DIAGNOSIS — M5431 Sciatica, right side: Secondary | ICD-10-CM | POA: Diagnosis not present

## 2024-06-09 ENCOUNTER — Other Ambulatory Visit: Payer: Self-pay | Admitting: Family Medicine

## 2024-06-09 DIAGNOSIS — K219 Gastro-esophageal reflux disease without esophagitis: Secondary | ICD-10-CM

## 2024-07-26 DIAGNOSIS — M5451 Vertebrogenic low back pain: Secondary | ICD-10-CM | POA: Diagnosis not present

## 2024-07-26 DIAGNOSIS — M5432 Sciatica, left side: Secondary | ICD-10-CM | POA: Diagnosis not present

## 2024-07-26 DIAGNOSIS — M9903 Segmental and somatic dysfunction of lumbar region: Secondary | ICD-10-CM | POA: Diagnosis not present

## 2024-07-26 DIAGNOSIS — M5431 Sciatica, right side: Secondary | ICD-10-CM | POA: Diagnosis not present

## 2024-08-06 ENCOUNTER — Ambulatory Visit: Payer: Self-pay

## 2024-08-06 ENCOUNTER — Encounter: Payer: Self-pay | Admitting: Family

## 2024-08-06 ENCOUNTER — Ambulatory Visit: Admitting: Urology

## 2024-08-06 ENCOUNTER — Ambulatory Visit: Admitting: Family

## 2024-08-06 VITALS — BP 129/83 | HR 84 | Temp 97.7°F | Ht 70.0 in | Wt 182.4 lb

## 2024-08-06 DIAGNOSIS — J0100 Acute maxillary sinusitis, unspecified: Secondary | ICD-10-CM

## 2024-08-06 MED ORDER — METHYLPREDNISOLONE ACETATE 80 MG/ML IJ SUSP
80.0000 mg | Freq: Once | INTRAMUSCULAR | Status: AC
  Administered 2024-08-06: 80 mg via INTRAMUSCULAR

## 2024-08-06 MED ORDER — AMOXICILLIN-POT CLAVULANATE 875-125 MG PO TABS: 1.0000 | ORAL_TABLET | Freq: Two times a day (BID) | ORAL | 0 refills | Status: DC

## 2024-08-06 NOTE — Telephone Encounter (Signed)
 Appt made.

## 2024-08-06 NOTE — Telephone Encounter (Signed)
 FYI Only or Action Required?: FYI only for provider: appointment scheduled on 08/06/24.  Patient was last seen in primary care on 03/20/2024 by Dettinger, Fonda LABOR, MD.  Called Nurse Triage reporting Sinus Problem and Nasal Congestion.  Symptoms began several days ago.  Interventions attempted: Rest, hydration, or home remedies.  Symptoms are: gradually worsening.  Triage Disposition: See PCP When Office is Open (Within 3 Days)  Patient/caregiver understands and will follow disposition?: Yes        Copied from CRM #[id number]. Topic: Clinical - Red Word Triage >> Aug 06, 2024  7:49 AM Ivette P wrote: Red Word that prompted transfer to Nurse Triage: drianage - unable to sleep   sinus issues - Red from bleeding due to drainage. Reason for Disposition  Lots of coughing  Answer Assessment - Initial Assessment Questions 1. LOCATION: Where does it hurt?      Drainage in my head 2. ONSET: When did the sinus pain start?  (e.g., hours, days)      A few days 3. SEVERITY: How bad is the pain?   (Scale 0-10; or none, mild, moderate or severe)     Kept pt up last night 4. RECURRENT SYMPTOM: Have you ever had sinus problems before? If Yes, ask: When was the last time? and What happened that time?      yes 5. NASAL CONGESTION: Is the nose blocked? If Yes, ask: Can you open it or must you breathe through your mouth?     *No Answer* 6. NASAL DISCHARGE: Do you have discharge from your nose? If so ask, What color?     Green with blood 7. FEVER: Do you have a fever? If Yes, ask: What is it, how was it measured, and when did it start?      denies 8. OTHER SYMPTOMS: Do you have any other symptoms? (e.g., sore throat, cough, earache, difficulty breathing)     Cough, sore throat 9. PREGNANCY: Is there any chance you are pregnant? When was your last menstrual period?     N/a  Protocols used: Sinus Pain or Congestion-A-AH

## 2024-08-06 NOTE — Patient Instructions (Signed)

## 2024-08-06 NOTE — Progress Notes (Signed)
 Subjective:    Patient ID: George Garcia, male    DOB: 1961-02-25, 64 y.o.   MRN: [id number]  Chief Complaint  Patient presents with   Sinusitis    Cough congestion, states its in his head.    PT presents to the office today with cough and congestion that started a few weeks, but the last 4 days has greatly worsen. He has taken his Flonase , Singular and Allergra.  Sinusitis This is a new problem. The current episode started 1 to 4 weeks ago. The problem has been gradually worsening since onset. There has been no fever. His pain is at a severity of 2/10. The pain is mild. Associated symptoms include congestion, coughing, headaches, sinus pressure, sneezing and a sore throat. Pertinent negatives include no ear pain or shortness of breath. Past treatments include acetaminophen , saline nose sprays and nasal decongestants. The treatment provided mild relief.      Review of Systems  HENT:  Positive for congestion, sinus pressure, sneezing and sore throat. Negative for ear pain.   Respiratory:  Positive for cough. Negative for shortness of breath.   Neurological:  Positive for headaches.  All other systems reviewed and are negative.   Social History   Socioeconomic History   Marital status: Married    Spouse name: Not on file   Number of children: 3   Years of education: Not on file   Highest education level: Bachelor's degree (e.g., BA, AB, BS)  Occupational History   Occupation: retired  Tobacco Use   Smoking status: Never   Smokeless tobacco: Never  Vaping Use   Vaping status: Never Used  Substance and Sexual Activity   Alcohol use: Never   Drug use: Never   Sexual activity: Yes    Comment: married since 1999, 3 child 38, 14, 11  Other Topics Concern   Not on file  Social History Narrative   Right handed   Lives at home with wife   Caffeine: 1.5 cups/day, some days 4-12 oz of energy drink   Social Drivers of Health   Financial Resource Strain: Low Risk  (03/16/2024)    Overall Financial Resource Strain (CARDIA)    Difficulty of Paying Living Expenses: Not hard at all  Food Insecurity: No Food Insecurity (03/16/2024)   Hunger Vital Sign    Worried About Running Out of Food in the Last Year: Never true    Ran Out of Food in the Last Year: Never true  Transportation Needs: No Transportation Needs (03/16/2024)   PRAPARE - Administrator, Civil Service (Medical): No    Lack of Transportation (Non-Medical): No  Physical Activity: Sufficiently Active (03/16/2024)   Exercise Vital Sign    Days of Exercise per Week: 6 days    Minutes of Exercise per Session: 60 min  Stress: No Stress Concern Present (03/16/2024)   [employer] of Occupational Health - Occupational Stress Questionnaire    Feeling of Stress: Not at all  Social Connections: Socially Integrated (03/16/2024)   Social Connection and Isolation Panel    Frequency of Communication with Friends and Family: Once a week    Frequency of Social Gatherings with Friends and Family: Twice a week    Attends Religious Services: More than 4 times per year    Active Member of [employer] or Organizations: Yes    Attends Engineer, Structural: More than 4 times per year    Marital Status: Married   Family History  Problem Relation Age of  Onset   Stroke Mother    Diabetes Mother    Stroke Father    Kidney disease Sister    Kidney disease Brother    Fibroids Daughter    Healthy Daughter    Healthy Daughter    Healthy Son    Colon cancer Neg Hx    Cancer - Colon Neg Hx    Rectal cancer Neg Hx    Stomach cancer Neg Hx    Esophageal cancer Neg Hx    Colon polyps Neg Hx         Objective:   Physical Exam Vitals reviewed.  Constitutional:      General: He is not in acute distress.    Appearance: He is well-developed.  HENT:     Head: Normocephalic.     Right Ear: Tympanic membrane normal.     Left Ear: Tympanic membrane normal.     Nose:     Right Turbinates: Swollen.     Left  Turbinates: Swollen.     Right Sinus: Maxillary sinus tenderness present.     Left Sinus: Maxillary sinus tenderness present.  Eyes:     General:        Right eye: No discharge.        Left eye: No discharge.     Pupils: Pupils are equal, round, and reactive to light.  Neck:     Thyroid : No thyromegaly.  Cardiovascular:     Rate and Rhythm: Normal rate and regular rhythm.     Heart sounds: Normal heart sounds. No murmur heard. Pulmonary:     Effort: Pulmonary effort is normal. No respiratory distress.     Breath sounds: Normal breath sounds. No wheezing.  Abdominal:     General: Bowel sounds are normal. There is no distension.     Palpations: Abdomen is soft.     Tenderness: There is no abdominal tenderness.  Musculoskeletal:        General: No tenderness. Normal range of motion.     Cervical back: Normal range of motion and neck supple.  Skin:    General: Skin is warm and dry.     Findings: No erythema or rash.  Neurological:     Mental Status: He is alert and oriented to person, place, and time.     Cranial Nerves: No cranial nerve deficit.     Deep Tendon Reflexes: Reflexes are normal and symmetric.  Psychiatric:        Behavior: Behavior normal.        Thought Content: Thought content normal.        Judgment: Judgment normal.       BP 129/83   Pulse 84   Temp 97.7 F (36.5 C) (Temporal)   Ht 5' 10 (1.778 m)   Wt 182 lb 6.4 oz (82.7 kg)   SpO2 99%   BMI 26.17 kg/m      Assessment & Plan:  George Garcia comes in today with chief complaint of Sinusitis (Cough congestion, states its in his head. )   Diagnosis and orders addressed:  1. Acute non-recurrent maxillary sinusitis (Primary) - Take meds as prescribed - Use a cool mist humidifier  -Use saline nose sprays frequently -Force fluids -For any cough or congestion  Use plain Mucinex- regular strength or max strength is fine -For fever or aces or pains- take tylenol  or ibuprofen . -Throat lozenges if  help -Follow up if symptoms worsen or do not improve  - amoxicillin-clavulanate (AUGMENTIN) 875-125 MG tablet;  Take 1 tablet by mouth 2 (two) times daily.  Dispense: 14 tablet; Refill: 0 - methylPREDNISolone  acetate (DEPO-MEDROL ) injection 80 mg       Cadynce Garrette, FNP

## 2024-08-19 ENCOUNTER — Other Ambulatory Visit: Payer: Self-pay | Admitting: Family Medicine

## 2024-08-19 DIAGNOSIS — E782 Mixed hyperlipidemia: Secondary | ICD-10-CM

## 2024-08-19 DIAGNOSIS — I159 Secondary hypertension, unspecified: Secondary | ICD-10-CM

## 2024-08-23 DIAGNOSIS — M5432 Sciatica, left side: Secondary | ICD-10-CM | POA: Diagnosis not present

## 2024-08-23 DIAGNOSIS — M5431 Sciatica, right side: Secondary | ICD-10-CM | POA: Diagnosis not present

## 2024-08-23 DIAGNOSIS — M5451 Vertebrogenic low back pain: Secondary | ICD-10-CM | POA: Diagnosis not present

## 2024-08-23 DIAGNOSIS — M9903 Segmental and somatic dysfunction of lumbar region: Secondary | ICD-10-CM | POA: Diagnosis not present

## 2024-09-23 DIAGNOSIS — I159 Secondary hypertension, unspecified: Secondary | ICD-10-CM

## 2024-10-03 ENCOUNTER — Encounter: Payer: Self-pay | Admitting: Family Medicine

## 2024-10-03 ENCOUNTER — Ambulatory Visit: Admitting: Family Medicine

## 2024-10-03 VITALS — BP 102/63 | HR 69 | Ht 70.0 in | Wt 181.0 lb

## 2024-10-03 DIAGNOSIS — J321 Chronic frontal sinusitis: Secondary | ICD-10-CM | POA: Diagnosis not present

## 2024-10-03 DIAGNOSIS — I10 Essential (primary) hypertension: Secondary | ICD-10-CM

## 2024-10-03 DIAGNOSIS — Z0001 Encounter for general adult medical examination with abnormal findings: Secondary | ICD-10-CM

## 2024-10-03 DIAGNOSIS — Z125 Encounter for screening for malignant neoplasm of prostate: Secondary | ICD-10-CM | POA: Diagnosis not present

## 2024-10-03 DIAGNOSIS — K219 Gastro-esophageal reflux disease without esophagitis: Secondary | ICD-10-CM | POA: Diagnosis not present

## 2024-10-03 DIAGNOSIS — E782 Mixed hyperlipidemia: Secondary | ICD-10-CM

## 2024-10-03 DIAGNOSIS — I159 Secondary hypertension, unspecified: Secondary | ICD-10-CM

## 2024-10-03 DIAGNOSIS — Z0000 Encounter for general adult medical examination without abnormal findings: Secondary | ICD-10-CM

## 2024-10-03 LAB — LIPID PANEL

## 2024-10-03 MED ORDER — MONTELUKAST SODIUM 10 MG PO TABS: 10.0000 mg | ORAL_TABLET | Freq: Every day | ORAL | 3 refills | Status: AC

## 2024-10-03 MED ORDER — ROSUVASTATIN CALCIUM 5 MG PO TABS: 5.0000 mg | ORAL_TABLET | Freq: Every day | ORAL | 3 refills | Status: AC

## 2024-10-03 MED ORDER — OMEPRAZOLE 40 MG PO CPDR: 40.0000 mg | DELAYED_RELEASE_CAPSULE | Freq: Two times a day (BID) | ORAL | 3 refills | Status: AC

## 2024-10-03 MED ORDER — LORATADINE 10 MG PO TABS: 10.0000 mg | ORAL_TABLET | Freq: Every day | ORAL | 3 refills | Status: AC

## 2024-10-03 MED ORDER — AMLODIPINE BESYLATE 5 MG PO TABS: 5.0000 mg | ORAL_TABLET | Freq: Every day | ORAL | 3 refills | Status: AC

## 2024-10-03 MED ORDER — HYDROCODONE BIT-HOMATROP MBR 5-1.5 MG/5ML PO SOLN: 5.0000 mL | Freq: Three times a day (TID) | ORAL | 0 refills | Status: AC | PRN

## 2024-10-03 NOTE — Progress Notes (Signed)
 "  BP 102/63   Pulse 69   Ht 5' 10 (1.778 m)   Wt 181 lb (82.1 kg)   SpO2 98%   BMI 25.97 kg/m    Subjective:   Patient ID: George Garcia, male    DOB: 06-Aug-1961, 64 y.o.   MRN: [id number]  HPI: George Garcia is a 64 y.o. male presenting on 10/03/2024 for Medical Management of Chronic Issues (CPE)   Discussed the use of AI scribe software for clinical note transcription with the patient, who gave verbal consent to proceed.  History of Present Illness   George Garcia is a 64 year old male who presents for an annual physical exam and evaluation of persistent allergy symptoms.  Allergic rhinitis symptoms - Persistent allergy symptoms throughout 2025, described as the worst year for allergies. - Symptoms most significant twice daily, in the morning and just before bedtime. - Current regimen includes Singulair  at night, Allegra, Flonase , and Delsym DEM, which provide partial relief. - Previous steroid injection and ten-day steroid pack provided temporary symptom alleviation. - Both he and his wife have experienced worsened allergies this year.  Hyperlipidemia management - Continues daily Crestor  for cholesterol management without adverse effects.  Gastroesophageal reflux disease - Continues omeprazole , which effectively controls symptoms.  Sleep and urinary function - No snoring-related breathing interruptions. - No urinary stream issues.  Glycemic status - No history of blood sugar problems. - Has already eaten prior to the visit, which may affect blood sugar levels in upcoming laboratory tests.          Relevant past medical, surgical, family and social history reviewed and updated as indicated. Interim medical history since our last visit reviewed. Allergies and medications reviewed and updated.  Review of Systems  Constitutional:  Negative for chills and fever.  HENT:  Positive for congestion. Negative for ear pain and tinnitus.   Eyes:  Negative for pain and visual  disturbance.  Respiratory:  Positive for cough. Negative for shortness of breath and wheezing.   Cardiovascular:  Negative for chest pain, palpitations and leg swelling.  Gastrointestinal:  Negative for abdominal pain, blood in stool, constipation and diarrhea.  Genitourinary:  Negative for dysuria and hematuria.  Musculoskeletal:  Negative for back pain and myalgias.  Skin:  Negative for rash.  Neurological:  Negative for dizziness, weakness, light-headedness and headaches.  Psychiatric/Behavioral:  Negative for suicidal ideas.   All other systems reviewed and are negative.   Per HPI unless specifically indicated above   Allergies as of 10/03/2024   No Known Allergies      Medication List        Accurate as of October 03, 2024  3:57 PM. If you have any questions, ask your nurse or doctor.          STOP taking these medications    amoxicillin -clavulanate 875-125 MG tablet Commonly known as: AUGMENTIN  Stopped by: Fonda Levins, MD       TAKE these medications    albuterol  108 (90 Base) MCG/ACT inhaler Commonly known as: VENTOLIN  HFA Inhale 2 puffs into the lungs every 6 (six) hours as needed for wheezing or shortness of breath.   amLODipine  5 MG tablet Commonly known as: NORVASC  Take 1 tablet (5 mg total) by mouth daily.   cyclobenzaprine  10 MG tablet Commonly known as: FLEXERIL  Take 1 tablet (10 mg total) by mouth 3 (three) times daily as needed for muscle spasms.   dicyclomine  20 MG tablet Commonly known as: BENTYL  TAKE 1 TABLET (20 MG  TOTAL) BY MOUTH EVERY 6 (SIX) HOURS.   Duexis 800-26.6 MG Tabs Generic drug: Ibuprofen -Famotidine Take by mouth as needed.   fexofenadine  180 MG tablet Commonly known as: ALLEGRA Take 180 mg by mouth daily.   fluticasone  50 MCG/ACT nasal spray Commonly known as: FLONASE  PLACE 1 SPRAY INTO BOTH NOSTRILS 2 (TWO) TIMES DAILY AS NEEDED FOR ALLERGIES OR RHINITIS.   HM Salonpas  Pain Relief  1.2-5.7-6.3 % Ptch Generic  drug: Camphor-Menthol-Methyl Sal Apply 1 patch topically daily as needed.   HYDROcodone  bit-homatropine 5-1.5 MG/5ML syrup Commonly known as: HYCODAN Take 5 mLs by mouth every 8 (eight) hours as needed for cough. Started by: Fonda Levins, MD   hydrocortisone 25 MG suppository Commonly known as: ANUSOL-HC UNWRAP AND INSERT 1 SUPPOSITORY 3 TIMES DAILY RECTALLY   ibuprofen  800 MG tablet Commonly known as: ADVIL  Take 800 mg by mouth 3 (three) times daily.   lactulose  10 GM/15ML solution Commonly known as: CHRONULAC  TAKE 30 MLS (20 G TOTAL) BY MOUTH 2 (TWO) TIMES DAILY.   lidocaine  5 % Commonly known as: LIDODERM  1 patch as needed.   loratadine  10 MG tablet Commonly known as: CLARITIN  Take 1 tablet (10 mg total) by mouth daily.   methocarbamol  500 MG tablet Commonly known as: ROBAXIN  Take 1 tablet (500 mg total) by mouth 4 (four) times daily.   montelukast  10 MG tablet Commonly known as: SINGULAIR  Take 1 tablet (10 mg total) by mouth at bedtime. What changed: See the new instructions. Changed by: Fonda Levins, MD   omeprazole  40 MG capsule Commonly known as: PRILOSEC Take 1 capsule (40 mg total) by mouth in the morning and at bedtime.   ondansetron  4 MG tablet Commonly known as: ZOFRAN  Take 1 tablet (4 mg total) by mouth every 8 (eight) hours as needed for nausea or vomiting.   Pennsaid 2 % Soln Generic drug: diclofenac Sodium Apply topically daily as needed.   rosuvastatin  5 MG tablet Commonly known as: CRESTOR  Take 1 tablet (5 mg total) by mouth daily. What changed: additional instructions Changed by: Fonda Levins, MD         Objective:   BP 102/63   Pulse 69   Ht 5' 10 (1.778 m)   Wt 181 lb (82.1 kg)   SpO2 98%   BMI 25.97 kg/m   Wt Readings from Last 3 Encounters:  10/03/24 181 lb (82.1 kg)  08/06/24 182 lb 6.4 oz (82.7 kg)  05/21/24 179 lb 0.2 oz (81.2 kg)    Physical Exam Physical Exam   VITALS: BP- 102/63 HEENT: Ears normal.  Pharynx normal. NECK: Thyroid  normal, no masses. CHEST: Lungs clear to auscultation bilaterally. CARDIOVASCULAR: Heart regular rate and rhythm. ABDOMEN: Abdomen non-tender. EXTREMITIES: No edema, pulses normal. NEUROLOGICAL: Reflexes normal bilaterally.         Assessment & Plan:   Problem List Items Addressed This Visit       Cardiovascular and Mediastinum   Hypertension, essential   Relevant Medications   amLODipine  (NORVASC ) 5 MG tablet   rosuvastatin  (CRESTOR ) 5 MG tablet   Other Relevant Orders   CBC With Diff/Platelet   CMP14+EGFR   Lipid panel     Respiratory   Chronic sinusitis   Relevant Medications   loratadine  (CLARITIN ) 10 MG tablet   montelukast  (SINGULAIR ) 10 MG tablet   HYDROcodone  bit-homatropine (HYCODAN) 5-1.5 MG/5ML syrup   Other Relevant Orders   Ambulatory referral to Allergy     Digestive   GERD (gastroesophageal reflux disease)   Relevant Medications  omeprazole  (PRILOSEC) 40 MG capsule   Other Relevant Orders   Lipid panel     Other   Hyperlipidemia   Relevant Medications   amLODipine  (NORVASC ) 5 MG tablet   rosuvastatin  (CRESTOR ) 5 MG tablet   Other Visit Diagnoses       Physical exam    -  Primary   Relevant Orders   CBC With Diff/Platelet   CMP14+EGFR   Lipid panel   PSA, total and free     Secondary hypertension       Relevant Medications   amLODipine  (NORVASC ) 5 MG tablet   rosuvastatin  (CRESTOR ) 5 MG tablet     Prostate cancer screening       Relevant Orders   PSA, total and free          Chronic allergic rhinitis Persistent symptoms despite current medications. Allergy testing in [place]  may not reflect local allergens. Discussed potential for allergy shots. - Referred to allergist for further evaluation and potential allergy testing. - Continue Singulair , Allegra, Flonase , and Delsym DM.  Mixed hyperlipidemia Managed with Crestor . Blood work needed to assess lipid levels. - Ordered blood work to assess  cholesterol levels.  Gastroesophageal reflux disease Well-controlled with omeprazole . - Continue omeprazole .  General Health Maintenance Routine health maintenance discussed. Colonoscopy due this year or next. - Ordered blood work to assess kidney function, liver function, cholesterol, and blood sugar. - Ordered PSA test. - Checked colonoscopy schedule.          Follow up plan: Return in about 6 months (around 04/02/2025), or if symptoms worsen or fail to improve, for Hypertension and cholesterol recheck.  Counseling provided for all of the vaccine components Orders Placed This Encounter  Procedures   CBC With Diff/Platelet   CMP14+EGFR   Lipid panel   PSA, total and free   Ambulatory referral to Allergy    Fonda Levins, MD [facility] 10/03/2024, 3:57 PM     "

## 2024-10-04 LAB — LIPID PANEL
Cholesterol, Total: 202 mg/dL — AB (ref 100–199)
HDL: 53 mg/dL
LDL CALC COMMENT:: 3.8 ratio (ref 0.0–5.0)
LDL Chol Calc (NIH): 108 mg/dL — AB (ref 0–99)
Triglycerides: 241 mg/dL — AB (ref 0–149)
VLDL Cholesterol Cal: 41 mg/dL — AB (ref 5–40)

## 2024-10-04 LAB — CMP14+EGFR
ALT: 33 IU/L (ref 0–44)
AST: 21 IU/L (ref 0–40)
Albumin: 5 g/dL — AB (ref 3.9–4.9)
Alkaline Phosphatase: 91 IU/L (ref 47–123)
BUN/Creatinine Ratio: 16 (ref 10–24)
BUN: 17 mg/dL (ref 8–27)
Bilirubin Total: 0.3 mg/dL (ref 0.0–1.2)
CO2: 26 mmol/L (ref 20–29)
Calcium: 10 mg/dL (ref 8.6–10.2)
Chloride: 101 mmol/L (ref 96–106)
Creatinine, Ser: 1.04 mg/dL (ref 0.76–1.27)
Globulin, Total: 2.2 g/dL (ref 1.5–4.5)
Glucose: 108 mg/dL — AB (ref 70–99)
Potassium: 4.7 mmol/L (ref 3.5–5.2)
Sodium: 140 mmol/L (ref 134–144)
Total Protein: 7.2 g/dL (ref 6.0–8.5)
eGFR: 81 mL/min/1.73

## 2024-10-04 LAB — CBC WITH DIFF/PLATELET
Basophils Absolute: 0 x10E3/uL (ref 0.0–0.2)
Basos: 1 %
EOS (ABSOLUTE): 0.2 x10E3/uL (ref 0.0–0.4)
Eos: 3 %
Hematocrit: 48.9 % (ref 37.5–51.0)
Hemoglobin: 16.3 g/dL (ref 13.0–17.7)
Immature Grans (Abs): 0 x10E3/uL (ref 0.0–0.1)
Immature Granulocytes: 0 %
Lymphocytes Absolute: 1.8 x10E3/uL (ref 0.7–3.1)
Lymphs: 34 %
MCH: 30.2 pg (ref 26.6–33.0)
MCHC: 33.3 g/dL (ref 31.5–35.7)
MCV: 91 fL (ref 79–97)
Monocytes Absolute: 0.5 x10E3/uL (ref 0.1–0.9)
Monocytes: 9 %
Neutrophils Absolute: 2.8 x10E3/uL (ref 1.4–7.0)
Neutrophils: 52 %
Platelets: 274 x10E3/uL (ref 150–450)
RBC: 5.4 x10E6/uL (ref 4.14–5.80)
RDW: 12.8 % (ref 11.6–15.4)
WBC: 5.3 x10E3/uL (ref 3.4–10.8)

## 2024-10-04 LAB — PSA, TOTAL AND FREE
PSA, Free Pct: 41.3 %
PSA, Free: 0.33 ng/mL
Prostate Specific Ag, Serum: 0.8 ng/mL (ref 0.0–4.0)

## 2024-10-11 ENCOUNTER — Ambulatory Visit: Payer: Self-pay | Admitting: Family Medicine

## 2024-10-24 ENCOUNTER — Ambulatory Visit: Admitting: Allergy & Immunology

## 2024-10-24 ENCOUNTER — Other Ambulatory Visit: Payer: Self-pay

## 2024-10-24 ENCOUNTER — Encounter: Payer: Self-pay | Admitting: Allergy & Immunology

## 2024-10-24 VITALS — BP 122/88 | HR 61 | Ht 67.25 in | Wt 174.0 lb

## 2024-10-24 DIAGNOSIS — J310 Chronic rhinitis: Secondary | ICD-10-CM

## 2024-10-24 DIAGNOSIS — J4520 Mild intermittent asthma, uncomplicated: Secondary | ICD-10-CM

## 2024-10-24 NOTE — Progress Notes (Addendum)
 "  NEW PATIENT  Date of Service/Encounter:  10/24/24  Consult requested by: Dettinger, Fonda LABOR, MD   Assessment:   Chronic rhinitis - planning for skin testing at the next visit  Mild intermittent asthma, uncomplicated  Retired from [employer]   Plan/Recommendations:   1. Chronic rhinitis  - Because of insurance stipulations, we cannot do skin testing on the same day as your first visit. - We are all working to fight this, but for now we need to do two separate visits.  - We will know more after we do testing at the next visit.  - The skin testing visit can be squeezed in at your convenience.  - Then we can make a more full plan to address all of your symptoms. - Be sure to stop your antihistamines (CLARITIN , ALLEGRA, ZYRTEC, ETC) for 3 days before this appointment.  - In the meantime, start Xhance  one spray per nostril daily (in place of your Flonase ). - Sample provided. - Let us  know if you like it and we can send in a prescription next time.   2. Mild intermittent asthma, uncomplicated - Lung testing looks great today. - Continue with albuterol  as needed.   3. Return in about 1 week (around 10/31/2024) for SKIN TESTING (1-55). You can have the follow up appointment with Dr. Iva or a Nurse Practicioner (our Nurse Practitioners are excellent and always have Physician oversight!).   This note in its entirety was forwarded to the Provider who requested this consultation.  Subjective:   George Garcia is a 64 y.o. male presenting today for evaluation of  Chief Complaint  Patient presents with   Establish Care   Allergic Rhinitis     Current regimen includes Singulair  at night, Allegra, Flonase , and Delsym DEM, and mucinex DM which provide partial relief. Nasal drainage and post nasal drip causing hoarseness.     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  Discussed the use of AI scribe software for clinical note transcription with the patient and/or guardian, who gave verbal consent to proceed.  George Garcia was referred by Dettinger, Fonda LABOR, MD.     George Garcia is a 64 y.o. male presenting for an evaluation of chronic sinusitis.  He has chronic sinus issues that have worsened over time, experiencing frequent sinus infections, stating 'lately, all year.' He was treated with antibiotics about three months ago, receiving a shot and a 10-day course, but did not notice improvement. He uses Flonase  nasal spray nightly and alternates between Allegra and Claritin  for antihistamine use. Montelukast  has been part of his regimen for a while, but he does not take all medications simultaneously. He has not had a sinus CT or seen an ear, nose, and throat specialist.  Asthma/Respiratory Symptom History: He has a history of asthma. He had issues last a couple of years ago. He exercises six days a week and does not limit his activity due to shortness of breath. He uses an albuterol   inhaler but did not specify recent use.  Allergic Rhinitis Symptom History: He has had sinus issues for his entire life. It has gotten worse lately. He has them all year long. He was treated last with antibiotics 3 months ago. He uses saline nasal flushes weekly and has an outdoor cat. He lives in [place] and is open to visiting [place] for appointments. Approximately ten years ago, he underwent allergy testing in [place] , which  showed positive results for grass, certain smokes, and other allergens. After retiring from the [employer], he moved back to [place] .   He did have testing done when he lived in [place] . This was around ten years ago. He had positives to grass as well as certain scents.   Food Allergy Symptom History: He has what he calls stomach allergies, which seems to more related to acidivd foods. He avoid tomatoes, lemons, oranges, vinegar, and peppermints. He avoids ood allergies that cause acid reflux and burning sensations, particularly with acidic foods like tomatoes, oranges, lemons, vinegar, and watermelon. He takes omeprazole  for reflux and occasionally uses Tums, though he has reduced his intake recently. He has had normal endoscopic evaluations in the past.  GERD Symptom History: He has had endoscopies and everything has been normal.   Infection Symptom History: He has a history of pneumonia in the 1980s but no recent occurrences. He has received a steroid shot once a year for sinus infections in the past. He has not received vaccines for pneumonia or shingles and prefers to avoid vaccines.  Otherwise, there is no history of other atopic diseases, including drug allergies, stinging insect allergies, or contact dermatitis. There is no significant infectious history. Vaccinations are up to date.    Past Medical History: Patient Active Problem List   Diagnosis Date Noted   Suprapubic pain 05/21/2024   Hypertension, essential 11/30/2023   Hyperlipidemia 06/07/2023   DDD (degenerative disc disease), lumbar 07/04/2019   Chronic midline low back pain with right-sided sciatica 07/04/2019   Disease related peripheral neuropathy 04/08/2019   GERD (gastroesophageal reflux disease) 12/05/2018   Impingement syndrome of left shoulder 09/05/2018   IBS (irritable bowel syndrome) 09/05/2018   Chronic sinusitis 09/05/2018    Medication List:  Allergies as of 10/24/2024   No Known  Allergies      Medication List        Accurate as of October 24, 2024 10:14 AM. If you have any questions, ask your nurse or doctor.          albuterol  108 (90 Base) MCG/ACT inhaler Commonly known as: VENTOLIN  HFA Inhale 2 puffs into the lungs every 6 (six) hours as needed for wheezing or shortness of breath.   amLODipine  5 MG tablet Commonly known as: NORVASC  Take 1 tablet (5 mg total) by mouth daily.   cyclobenzaprine  10 MG tablet Commonly known as: FLEXERIL  Take 1 tablet (10 mg total) by mouth 3 (three) times daily as needed for muscle spasms.   dicyclomine  20 MG tablet Commonly known as: BENTYL  TAKE 1 TABLET (20 MG TOTAL) BY MOUTH EVERY 6 (SIX) HOURS.   Duexis 800-26.6 MG Tabs Generic drug: Ibuprofen -Famotidine Take by mouth as needed.   fexofenadine  180 MG tablet Commonly known as: ALLEGRA Take 180 mg by mouth daily.   fluticasone  50 MCG/ACT nasal spray Commonly known as: FLONASE  PLACE 1 SPRAY INTO BOTH NOSTRILS 2 (TWO) TIMES DAILY AS NEEDED FOR ALLERGIES OR RHINITIS.   HM Salonpas  Pain Relief  1.2-5.7-6.3 % Ptch Generic drug: Camphor-Menthol-Methyl Sal Apply  1 patch topically daily as needed.   HYDROcodone  bit-homatropine 5-1.5 MG/5ML syrup Commonly known as: HYCODAN Take 5 mLs by mouth every 8 (eight) hours as needed for cough.   hydrocortisone 25 MG suppository Commonly known as: ANUSOL-HC UNWRAP AND INSERT 1 SUPPOSITORY 3 TIMES DAILY RECTALLY   ibuprofen  800 MG tablet Commonly known as: ADVIL  Take 800 mg by mouth 3 (three) times daily.   lactulose  10 GM/15ML solution Commonly known as: CHRONULAC  TAKE 30 MLS (20 G TOTAL) BY MOUTH 2 (TWO) TIMES DAILY.   lidocaine  5 % Commonly known as: LIDODERM  1 patch as needed.   loratadine  10 MG tablet Commonly known as: CLARITIN  Take 1 tablet (10 mg total) by mouth daily.   methocarbamol  500 MG tablet Commonly known as: ROBAXIN  Take 1 tablet (500 mg total) by mouth 4 (four) times daily.   montelukast   10 MG tablet Commonly known as: SINGULAIR  Take 1 tablet (10 mg total) by mouth at bedtime.   omeprazole  40 MG capsule Commonly known as: PRILOSEC Take 1 capsule (40 mg total) by mouth in the morning and at bedtime.   ondansetron  4 MG tablet Commonly known as: ZOFRAN  Take 1 tablet (4 mg total) by mouth every 8 (eight) hours as needed for nausea or vomiting.   Pennsaid 2 % Soln Generic drug: diclofenac Sodium Apply topically daily as needed.   rosuvastatin  5 MG tablet Commonly known as: CRESTOR  Take 1 tablet (5 mg total) by mouth daily.        Birth History: non-contributory  Developmental History: non-contributory  Past Surgical History: Past Surgical History:  Procedure Laterality Date   colonscopy  2019   HEMORRHOID SURGERY N/A 09/01/2021   Procedure: SINGLE COLUMN HEMORRHOIDECTOMY;  Surgeon: Debby Hila, MD;  Location: [facility] [place];  Service: General;  Laterality: N/A;   SHOULDER SURGERY Right 2007   rotator cuff repair     Family History: Family History  Problem Relation Age of Onset   Stroke Mother    Diabetes Mother    Stroke Father    Allergic rhinitis Sister    Kidney disease Sister    Allergic rhinitis Sister    Allergic rhinitis Sister    Allergic rhinitis Brother    Allergic rhinitis Brother    Kidney disease Brother    Allergic rhinitis Brother    Allergic rhinitis Brother    Fibroids Daughter    Healthy Daughter    Healthy Son    Eczema Grandson    Colon cancer Neg Hx    Cancer - Colon Neg Hx    Rectal cancer Neg Hx    Stomach cancer Neg Hx    Esophageal cancer Neg Hx    Colon polyps Neg Hx      Social History: Jabreel lives at home with his wife. He lives in a house that is 75 years old. There is laminate and carpeting in the main living areas and carpeting in the bedroom. There is a heat pump for heating and cooling. There is central air conditioning.  There are dust mite covers on the bed of the pillows.  There is no  tobacco exposure.  He is currently retired.  There is no fume, chemical, or dust exposure.  There is a HEPA filter in the home.  They do not live near an interstate or industrial area.   Review of systems otherwise negative other than that mentioned in the HPI.    Objective:   Blood pressure 122/88, pulse 61, height 5' 7.25 (  1.708 m), weight 174 lb (78.9 kg), SpO2 99%. Body mass index is 27.05 kg/m.     Physical Exam Vitals reviewed.  Constitutional:      Appearance: He is well-developed.  HENT:     Head: Normocephalic and atraumatic.     Right Ear: Tympanic membrane, ear canal and external ear normal. No drainage, swelling or tenderness. Tympanic membrane is not injected, scarred, erythematous, retracted or bulging.     Left Ear: Tympanic membrane, ear canal and external ear normal. No drainage, swelling or tenderness. Tympanic membrane is not injected, scarred, erythematous, retracted or bulging.     Nose: No nasal deformity, septal deviation, mucosal edema or rhinorrhea.     Right Turbinates: Enlarged, swollen and pale.     Left Turbinates: Enlarged, swollen and pale.     Right Sinus: No maxillary sinus tenderness or frontal sinus tenderness.     Left Sinus: No maxillary sinus tenderness or frontal sinus tenderness.     Comments: No polyps.    Mouth/Throat:     Mouth: Mucous membranes are not pale and not dry.     Pharynx: Uvula midline.  Eyes:     General: Allergic shiner present.        Right eye: No discharge.        Left eye: No discharge.     Conjunctiva/sclera: Conjunctivae normal.     Right eye: Right conjunctiva is not injected. No chemosis.    Left eye: Left conjunctiva is not injected. No chemosis.    Pupils: Pupils are equal, round, and reactive to light.  Cardiovascular:     Rate and Rhythm: Normal rate and regular rhythm.     Heart sounds: Normal heart sounds.  Pulmonary:     Effort: Pulmonary effort is normal. No tachypnea, accessory muscle usage or  respiratory distress.     Breath sounds: Normal breath sounds. No wheezing, rhonchi or rales.  Chest:     Chest wall: No tenderness.  Abdominal:     Tenderness: There is no abdominal tenderness. There is no guarding or rebound.  Lymphadenopathy:     Head:     Right side of head: No submandibular, tonsillar or occipital adenopathy.     Left side of head: No submandibular, tonsillar or occipital adenopathy.     Cervical: No cervical adenopathy.  Skin:    Coloration: Skin is not pale.     Findings: No abrasion, erythema, petechiae or rash. Rash is not papular, urticarial or vesicular.  Neurological:     Mental Status: He is alert.  Psychiatric:        Behavior: Behavior is cooperative.      Diagnostic studies: deferred due to insurance stipulations that require a separate visit for testing  Spirometry: Normal FEV1, FVC, and FEV1/FVC ratio. There is no scooping suggestive of obstructive disease.  FEV1 is 2.03 (75%).  FVC is 2.33 (67%).  FEV1 to FVC ratio was 87%.           Marty Shaggy, MD [facility]       "

## 2024-10-24 NOTE — Patient Instructions (Addendum)
 1. Chronic rhinitis  - Because of insurance stipulations, we cannot do skin testing on the same day as your first visit. - We are all working to fight this, but for now we need to do two separate visits.  - We will know more after we do testing at the next visit.  - The skin testing visit can be squeezed in at your convenience.  - Then we can make a more full plan to address all of your symptoms. - Be sure to stop your antihistamines (CLARITIN , ALLEGRA, ZYRTEC, ETC) for 3 days before this appointment.  - In the meantime, start Xhance  one spray per nostril daily (in place of your Flonase ). - Sample provided. - Let us  know if you like it and we can send in a prescription next time.   2. Mild intermittent asthma, uncomplicated - Lung testing looks great today. - Continue with albuterol  as needed.   3. Return in about 1 week (around 10/31/2024) for SKIN TESTING (1-55). You can have the follow up appointment with Dr. Iva or a Nurse Practicioner (our Nurse Practitioners are excellent and always have Physician oversight!).    Please inform us  of any Emergency Department visits, hospitalizations, or changes in symptoms. Call us  before going to the ED for breathing or allergy symptoms since we might be able to fit you in for a sick visit. Feel free to contact us  anytime with any questions, problems, or concerns.  It was a pleasure to meet you and your bride today!  Websites that have reliable patient information: 1. American Academy of Asthma, Allergy, and Immunology: www.aaaai.org 2. Food Allergy Research and Education (FARE): foodallergy.org 3. Mothers of Asthmatics: http://www.asthmacommunitynetwork.org 4. American College of Allergy, Asthma, and Immunology: www.acaai.org      Like us  on [employer] and Instagram for our latest updates!      A healthy democracy works best when [employer] participate! Make sure you are registered to vote! If you have moved or changed any of your  contact information, you will need to get this updated before voting! Scan the QR codes below to learn more!

## 2024-10-24 NOTE — Addendum Note (Signed)
 Addended by: IVA MARTY SALTNESS on: 10/24/2024 10:48 AM   Modules accepted: Level of Service

## 2024-10-30 ENCOUNTER — Ambulatory Visit: Admitting: Family
